# Patient Record
Sex: Female | Born: 1995 | Race: White | Hispanic: No | Marital: Single | Smoking: Never smoker
Health system: Southern US, Community
[De-identification: ages and names within clinical notes are randomized; demographics above are authoritative.]

## PROBLEM LIST (undated history)

## (undated) ENCOUNTER — Inpatient Hospital Stay (HOSPITAL_COMMUNITY): Payer: Self-pay

## (undated) ENCOUNTER — Inpatient Hospital Stay (HOSPITAL_COMMUNITY): Payer: BLUE CROSS/BLUE SHIELD

## (undated) DIAGNOSIS — J45909 Unspecified asthma, uncomplicated: Secondary | ICD-10-CM

## (undated) DIAGNOSIS — R569 Unspecified convulsions: Secondary | ICD-10-CM

## (undated) DIAGNOSIS — E079 Disorder of thyroid, unspecified: Secondary | ICD-10-CM

## (undated) DIAGNOSIS — F419 Anxiety disorder, unspecified: Secondary | ICD-10-CM

## (undated) HISTORY — PX: OTHER SURGICAL HISTORY: SHX169

---

## 2015-08-04 NOTE — L&D Delivery Note (Signed)
Delivery Note At 01:03 AM a viable female, "Carmen Harper", was delivered via Vaginal, Spontaneous Delivery (Presentation:OA restituting to ROA). APGARS: 6, 8; weight 7 lbs 9.7 oz (3450 g).   Placenta status: Spontaneous, intact. Cord: 3 vessels, with the following complications: Loose cord around neck/body, easily reduced. Cord pH: NA. Copious thick meconium after delivery.  Anesthesia: Epidural Episiotomy: NA Lacerations: 2nd degree vaginal Suture Repair: 3-0 chromic CT and SH Est. Blood Loss (mL): 300  Mom to postpartum.  Baby to Couplet care / Skin to Skin. Mom plans to breast and bottle feed. Undecided re: contraception. Unable to swallow pills, therefore will only accept liquid or chewable meds. Tyl, Ibuprofen and Percocet ordered in oral form. Inhalers ordered as needed.    Sherre ScarletKimberly Shaely Gadberry, CNM 04/06/16, 4:25 AM

## 2015-08-09 LAB — OB RESULTS CONSOLE ANTIBODY SCREEN: ANTIBODY SCREEN: NEGATIVE

## 2015-08-09 LAB — OB RESULTS CONSOLE GC/CHLAMYDIA
CHLAMYDIA, DNA PROBE: NEGATIVE
GC PROBE AMP, GENITAL: NEGATIVE

## 2015-08-09 LAB — OB RESULTS CONSOLE RPR: RPR: NONREACTIVE

## 2015-08-09 LAB — OB RESULTS CONSOLE HEPATITIS B SURFACE ANTIGEN: Hepatitis B Surface Ag: NEGATIVE

## 2015-08-09 LAB — OB RESULTS CONSOLE HIV ANTIBODY (ROUTINE TESTING): HIV: NONREACTIVE

## 2015-08-09 LAB — OB RESULTS CONSOLE ABO/RH: RH Type: POSITIVE

## 2015-08-09 LAB — OB RESULTS CONSOLE RUBELLA ANTIBODY, IGM: Rubella: IMMUNE

## 2015-12-16 ENCOUNTER — Emergency Department (HOSPITAL_BASED_OUTPATIENT_CLINIC_OR_DEPARTMENT_OTHER)
Admission: EM | Admit: 2015-12-16 | Discharge: 2015-12-16 | Disposition: A | Discharge status: Home / Self Care | Payer: BLUE CROSS/BLUE SHIELD | Attending: Emergency Medicine | Admitting: Emergency Medicine

## 2015-12-16 ENCOUNTER — Encounter (HOSPITAL_BASED_OUTPATIENT_CLINIC_OR_DEPARTMENT_OTHER): Payer: Self-pay

## 2015-12-16 DIAGNOSIS — O26899 Other specified pregnancy related conditions, unspecified trimester: Secondary | ICD-10-CM

## 2015-12-16 DIAGNOSIS — O26892 Other specified pregnancy related conditions, second trimester: Secondary | ICD-10-CM | POA: Diagnosis present

## 2015-12-16 DIAGNOSIS — R109 Unspecified abdominal pain: Secondary | ICD-10-CM | POA: Diagnosis not present

## 2015-12-16 DIAGNOSIS — O9989 Other specified diseases and conditions complicating pregnancy, childbirth and the puerperium: Secondary | ICD-10-CM

## 2015-12-16 DIAGNOSIS — O99512 Diseases of the respiratory system complicating pregnancy, second trimester: Secondary | ICD-10-CM | POA: Diagnosis not present

## 2015-12-16 DIAGNOSIS — R8271 Bacteriuria: Secondary | ICD-10-CM | POA: Diagnosis not present

## 2015-12-16 DIAGNOSIS — O212 Late vomiting of pregnancy: Secondary | ICD-10-CM | POA: Insufficient documentation

## 2015-12-16 DIAGNOSIS — J45909 Unspecified asthma, uncomplicated: Secondary | ICD-10-CM | POA: Insufficient documentation

## 2015-12-16 DIAGNOSIS — Z3A24 24 weeks gestation of pregnancy: Secondary | ICD-10-CM | POA: Diagnosis not present

## 2015-12-16 DIAGNOSIS — O99891 Other specified diseases and conditions complicating pregnancy: Secondary | ICD-10-CM

## 2015-12-16 HISTORY — DX: Unspecified asthma, uncomplicated: J45.909

## 2015-12-16 LAB — CBC WITH DIFFERENTIAL/PLATELET
Basophils Absolute: 0 10*3/uL (ref 0.0–0.1)
Basophils Relative: 0 %
EOS ABS: 0 10*3/uL (ref 0.0–0.7)
Eosinophils Relative: 0 %
HCT: 31 % — ABNORMAL LOW (ref 36.0–46.0)
Hemoglobin: 10.3 g/dL — ABNORMAL LOW (ref 12.0–15.0)
LYMPHS ABS: 1.9 10*3/uL (ref 0.7–4.0)
Lymphocytes Relative: 21 %
MCH: 30.3 pg (ref 26.0–34.0)
MCHC: 33.2 g/dL (ref 30.0–36.0)
MCV: 91.2 fL (ref 78.0–100.0)
MONO ABS: 0.5 10*3/uL (ref 0.1–1.0)
MONOS PCT: 6 %
NEUTROS PCT: 73 %
Neutro Abs: 6.5 10*3/uL (ref 1.7–7.7)
Platelets: 174 10*3/uL (ref 150–400)
RBC: 3.4 MIL/uL — ABNORMAL LOW (ref 3.87–5.11)
RDW: 14.6 % (ref 11.5–15.5)
WBC: 9.1 10*3/uL (ref 4.0–10.5)

## 2015-12-16 LAB — URINALYSIS, ROUTINE W REFLEX MICROSCOPIC
BILIRUBIN URINE: NEGATIVE
GLUCOSE, UA: NEGATIVE mg/dL
HGB URINE DIPSTICK: NEGATIVE
KETONES UR: NEGATIVE mg/dL
Nitrite: NEGATIVE
PH: 7.5 (ref 5.0–8.0)
PROTEIN: NEGATIVE mg/dL
Specific Gravity, Urine: 1.017 (ref 1.005–1.030)

## 2015-12-16 LAB — COMPREHENSIVE METABOLIC PANEL
ALBUMIN: 2.9 g/dL — AB (ref 3.5–5.0)
ALK PHOS: 43 U/L (ref 38–126)
ALT: 30 U/L (ref 14–54)
ANION GAP: 8 (ref 5–15)
AST: 23 U/L (ref 15–41)
BILIRUBIN TOTAL: 0.5 mg/dL (ref 0.3–1.2)
BUN: 6 mg/dL (ref 6–20)
CALCIUM: 8.3 mg/dL — AB (ref 8.9–10.3)
CO2: 22 mmol/L (ref 22–32)
CREATININE: 0.49 mg/dL (ref 0.44–1.00)
Chloride: 106 mmol/L (ref 101–111)
GFR calc Af Amer: >60 mL/min (ref >60)
GFR calc non Af Amer: >60 mL/min (ref >60)
GLUCOSE: 99 mg/dL (ref 65–99)
Potassium: 3.3 mmol/L — ABNORMAL LOW (ref 3.5–5.1)
SODIUM: 136 mmol/L (ref 135–145)
TOTAL PROTEIN: 6.3 g/dL — AB (ref 6.5–8.1)

## 2015-12-16 LAB — LIPASE, BLOOD: Lipase: 20 U/L (ref 11–51)

## 2015-12-16 LAB — URINE MICROSCOPIC-ADD ON: RBC / HPF: NONE SEEN RBC/hpf (ref 0–5)

## 2015-12-16 MED ORDER — POTASSIUM CHLORIDE 20 MEQ/15ML (10%) PO SOLN
40.0000 meq | Freq: Once | ORAL | Status: AC
  Administered 2015-12-16: 40 meq via ORAL
  Filled 2015-12-16: qty 30

## 2015-12-16 MED ORDER — SODIUM CHLORIDE 0.9 % IV BOLUS (SEPSIS)
1000.0000 mL | Freq: Once | INTRAVENOUS | Status: AC
  Administered 2015-12-16: 1000 mL via INTRAVENOUS

## 2015-12-16 MED ORDER — CEPHALEXIN 250 MG/5ML PO SUSR: 500.0000 mg | Freq: Two times a day (BID) | ORAL | Status: DC

## 2015-12-16 NOTE — ED Notes (Signed)
Family at bedside. 

## 2015-12-16 NOTE — ED Notes (Addendum)
[redacted] weeks pregnant-pt unsure if multiple births-c/o abd pain and pressure-denies vaginal bleeding and d/c-G1-NAD-steady gait

## 2015-12-16 NOTE — ED Notes (Signed)
Pt requesting ultrasound. States she has been told she had 2 yolk sacs at her 6 week ultrasound and also that they heard multiple heart rates at another visit. Reports at 18 week US she was told there was only one baby. Family at bedside reports they have a strong family history of multiple births and wants to be certain. Pt c/o feeling dizzy and pain on sides of abdomen since return from beach yesterday. Denies vaginal bleeding

## 2015-12-16 NOTE — ED Notes (Signed)
[facility] rapid response ob nurse, notified that patients and baby look perfect, no problems with heartrate or toco monitor,

## 2015-12-16 NOTE — ED Notes (Signed)
MD at bedside. 

## 2015-12-16 NOTE — ED Provider Notes (Signed)
CSN: [id number]     Arrival date & time 12/16/15  1807 History  By signing my name below, I, [facility], attest that this documentation has been prepared under the direction and in the presence of Pricilla Loveless, MD. Electronically Signed: Randell Patient, ED Scribe. 12/16/2015. 9:26 PM.   Chief Complaint  Patient presents with  . [redacted] weeks pregnant    The history is provided by the patient. No language interpreter was used.   HPI Comments: Carmen Harper is a 20 y.o. female who presents to the Emergency Department complaining of constant, waxing and waning abdominal pain onset yesterday. Pt states that she was traveling back from the beach when she became nauseous followed by one episode of emesis and then by abdominal pain along her bilateral sides and across her upper abdomen. She reports decreased fetal movement over the past 2 days. Per pt, at her most recent US she was told that there was only one fetus present but notes that at a previous US 18 weeks ago that their were two yolk sacs present indicating twins and heard multiple heartbeats at a different visit. Denies diarrhea, fever, dysuria, vaginal bleeding. G1P1A0  Past Medical History  Diagnosis Date  . Asthma    Past Surgical History  Procedure Laterality Date  . Breast tumor      No family history on file. Social History  Substance Use Topics  . Smoking status: Never Smoker   . Smokeless tobacco: None  . Alcohol Use: No   OB History    Gravida Para Term Preterm AB TAB SAB Ectopic Multiple Living   1              Review of Systems  Constitutional: Negative for fever.  Gastrointestinal: Positive for nausea, vomiting and abdominal pain. Negative for diarrhea.  Genitourinary: Negative for dysuria and vaginal bleeding.  All other systems reviewed and are negative.  Allergies  Sulfa antibiotics  Home Medications   Prior to Admission medications   Not on File   BP 106/66 mmHg  Pulse 77  Temp(Src) 98.6  F (37 C) (Oral)  Resp 20  Ht  (1.702 m)  Wt 189 lb (85.73 kg)  BMI 29.59 kg/m2  SpO2 100% Physical Exam  Constitutional: She is oriented to person, place, and time. She appears well-developed and well-nourished.  HENT:  Head: Normocephalic and atraumatic.  Right Ear: External ear normal.  Left Ear: External ear normal.  Nose: Nose normal.  Eyes: Right eye exhibits no discharge. Left eye exhibits no discharge.  Cardiovascular: Normal rate, regular rhythm and normal heart sounds.   Pulmonary/Chest: Effort normal and breath sounds normal.  Abdominal: Soft. There is tenderness. There is no CVA tenderness.  Mild diffuse tenderness. Soft abdomen. Gravid uterus. No CVA tenderness.  Neurological: She is alert and oriented to person, place, and time.  Skin: Skin is warm and dry.  Nursing note and vitals reviewed.   ED Course  Procedures   DIAGNOSTIC STUDIES: Oxygen Saturation is 100% on RA, normal by my interpretation.    COORDINATION OF CARE: 6:54 PM Will order IV fluids and labs. Discussed treatment plan with pt at bedside and pt agreed to plan.   Labs Review Labs Reviewed  URINALYSIS, ROUTINE W REFLEX MICROSCOPIC (NOT AT [facility]) - Abnormal; Notable for the following:    APPearance CLOUDY (*)    Leukocytes, UA SMALL (*)    All other components within normal limits  URINE MICROSCOPIC-ADD ON - Abnormal; Notable for the following:  Squamous Epithelial / LPF 0-5 (*)    Bacteria, UA MANY (*)    All other components within normal limits  COMPREHENSIVE METABOLIC PANEL - Abnormal; Notable for the following:    Potassium 3.3 (*)    Calcium 8.3 (*)    Total Protein 6.3 (*)    Albumin 2.9 (*)    All other components within normal limits  CBC WITH DIFFERENTIAL/PLATELET - Abnormal; Notable for the following:    RBC 3.40 (*)    Hemoglobin 10.3 (*)    HCT 31.0 (*)    All other components within normal limits  URINE CULTURE  LIPASE, BLOOD    Imaging Review No results  found. I have personally reviewed and evaluated these images and lab results as part of my medical decision-making.   EKG Interpretation None      MDM   Final diagnoses:  Abdominal pain affecting pregnancy  Asymptomatic bacteriuria during pregnancy in second trimester    Patient with vague abdominal tightness and pain since yesterday. One episode of vomiting. Reports decreased fetal movement but otherwise no vaginal bleeding, loss of fluid. Patient appears quite well. Patient symptoms are better after IV fluids. [facility] Fetal heart rate tracing is benign and no contractions. I did do a limited bedside ultrasound and only saw one fetus which I have discussed with patient. She does not have specific urinary symptoms but with complaints of abdominal pain and bacteria with some small leukocytes, will treat as asymptomatic bacteria versus UTI. Sure he has follow-up with her OB in 3 days. Discussed strict return precautions.  I personally performed the services described in this documentation, which was scribed in my presence. The recorded information has been reviewed and is accurate.    Pricilla LovelessScott Saraphina Lauderbaugh, MD 12/17/15 [id number]

## 2015-12-16 NOTE — ED Notes (Signed)
Ob rapid response notified that patient was placed on toco monitor, pt with no complaints currently, family at bedside. [facility] interpretation of monitor

## 2015-12-18 LAB — URINE CULTURE

## 2016-01-08 ENCOUNTER — Other Ambulatory Visit (HOSPITAL_COMMUNITY): Payer: Self-pay | Admitting: Obstetrics and Gynecology

## 2016-01-08 ENCOUNTER — Encounter (HOSPITAL_COMMUNITY): Payer: Self-pay | Admitting: *Deleted

## 2016-01-08 ENCOUNTER — Inpatient Hospital Stay (HOSPITAL_COMMUNITY)
Admission: AD | Admit: 2016-01-08 | Discharge: 2016-01-08 | Disposition: A | Discharge status: Home / Self Care | Payer: BLUE CROSS/BLUE SHIELD | Source: Ambulatory Visit | Attending: Obstetrics and Gynecology | Admitting: Obstetrics and Gynecology

## 2016-01-08 DIAGNOSIS — O368120 Decreased fetal movements, second trimester, not applicable or unspecified: Secondary | ICD-10-CM | POA: Insufficient documentation

## 2016-01-08 DIAGNOSIS — IMO0002 Reserved for concepts with insufficient information to code with codable children: Secondary | ICD-10-CM

## 2016-01-08 DIAGNOSIS — Z3A00 Weeks of gestation of pregnancy not specified: Secondary | ICD-10-CM | POA: Diagnosis not present

## 2016-01-08 NOTE — Discharge Instructions (Signed)
Fetal Movement Counts °Patient Name: __________________________________________________ Patient Due Date: ____________________ °Performing a fetal movement count is highly recommended in high-risk pregnancies, but it is good for every pregnant woman to do. Your health care provider may ask you to start counting fetal movements at 28 weeks of the pregnancy. Fetal movements often increase: °· After eating a full meal. °· After physical activity. °· After eating or drinking something sweet or cold. °· At rest. °Pay attention to when you feel the baby is most active. This will help you notice a pattern of your baby's sleep and wake cycles and what factors contribute to an increase in fetal movement. It is important to perform a fetal movement count at the same time each day when your baby is normally most active.  °HOW TO COUNT FETAL MOVEMENTS °· Find a quiet and comfortable area to sit or lie down on your left side. Lying on your left side provides the best blood and oxygen circulation to your baby. °· Write down the day and time on a sheet of paper or in a journal. °· Start counting kicks, flutters, swishes, rolls, or jabs in a 2-hour period. You should feel at least 10 movements within 2 hours. °· If you do not feel 10 movements in 2 hours, wait 2-3 hours and count again. Look for a change in the pattern or not enough counts in 2 hours. °SEEK MEDICAL CARE IF: °· You feel less than 10 counts in 2 hours, tried twice. °· There is no movement in over an hour. °· The pattern is changing or taking longer each day to reach 10 counts in 2 hours. °· You feel the baby is not moving as he or she usually does. °Date: ____________ Movements: ____________ Start time: ____________ Finish time: ____________  °Date: ____________ Movements: ____________ Start time: ____________ Finish time: ____________ °Date: ____________ Movements: ____________ Start time: ____________ Finish time: ____________ °Date: ____________ Movements:  ____________ Start time: ____________ Finish time: ____________ °Date: ____________ Movements: ____________ Start time: ____________ Finish time: ____________ °Date: ____________ Movements: ____________ Start time: ____________ Finish time: ____________ °Date: ____________ Movements: ____________ Start time: ____________ Finish time: ____________ °Date: ____________ Movements: ____________ Start time: ____________ Finish time: ____________  °Date: ____________ Movements: ____________ Start time: ____________ Finish time: ____________ °Date: ____________ Movements: ____________ Start time: ____________ Finish time: ____________ °Date: ____________ Movements: ____________ Start time: ____________ Finish time: ____________ °Date: ____________ Movements: ____________ Start time: ____________ Finish time: ____________ °Date: ____________ Movements: ____________ Start time: ____________ Finish time: ____________ °Date: ____________ Movements: ____________ Start time: ____________ Finish time: ____________ °Date: ____________ Movements: ____________ Start time: ____________ Finish time: ____________  °Date: ____________ Movements: ____________ Start time: ____________ Finish time: ____________ °Date: ____________ Movements: ____________ Start time: ____________ Finish time: ____________ °Date: ____________ Movements: ____________ Start time: ____________ Finish time: ____________ °Date: ____________ Movements: ____________ Start time: ____________ Finish time: ____________ °Date: ____________ Movements: ____________ Start time: ____________ Finish time: ____________ °Date: ____________ Movements: ____________ Start time: ____________ Finish time: ____________ °Date: ____________ Movements: ____________ Start time: ____________ Finish time: ____________  °Date: ____________ Movements: ____________ Start time: ____________ Finish time: ____________ °Date: ____________ Movements: ____________ Start time: ____________ Finish  time: ____________ °Date: ____________ Movements: ____________ Start time: ____________ Finish time: ____________ °Date: ____________ Movements: ____________ Start time: ____________ Finish time: ____________ °Date: ____________ Movements: ____________ Start time: ____________ Finish time: ____________ °Date: ____________ Movements: ____________ Start time: ____________ Finish time: ____________ °Date: ____________ Movements: ____________ Start time: ____________ Finish time: ____________  °Date: ____________ Movements: ____________ Start time: ____________ Finish   time: ____________ Date: ____________ Movements: ____________ Start time: ____________ Doreatha Martin time: ____________ Date: ____________ Movements: ____________ Start time: ____________ Doreatha Martin time: ____________ Date: ____________ Movements: ____________ Start time: ____________ Doreatha Martin time: ____________ Date: ____________ Movements: ____________ Start time: ____________ Doreatha Martin time: ____________ Date: ____________ Movements: ____________ Start time: ____________ Doreatha Martin time: ____________ Date: ____________ Movements: ____________ Start time: ____________ Doreatha Martin time: ____________  Date: ____________ Movements: ____________ Start time: ____________ Doreatha Martin time: ____________ Date: ____________ Movements: ____________ Start time: ____________ Doreatha Martin time: ____________ Date: ____________ Movements: ____________ Start time: ____________ Doreatha Martin time: ____________ Date: ____________ Movements: ____________ Start time: ____________ Doreatha Martin time: ____________ Date: ____________ Movements: ____________ Start time: ____________ Doreatha Martin time: ____________ Date: ____________ Movements: ____________ Start time: ____________ Doreatha Martin time: ____________ Date: ____________ Movements: ____________ Start time: ____________ Doreatha Martin time: ____________  Date: ____________ Movements: ____________ Start time: ____________ Doreatha Martin time: ____________ Date: ____________  Movements: ____________ Start time: ____________ Doreatha Martin time: ____________ Date: ____________ Movements: ____________ Start time: ____________ Doreatha Martin time: ____________ Date: ____________ Movements: ____________ Start time: ____________ Doreatha Martin time: ____________ Date: ____________ Movements: ____________ Start time: ____________ Doreatha Martin time: ____________ Date: ____________ Movements: ____________ Start time: ____________ Doreatha Martin time: ____________ Date: ____________ Movements: ____________ Start time: ____________ Doreatha Martin time: ____________  Date: ____________ Movements: ____________ Start time: ____________ Doreatha Martin time: ____________ Date: ____________ Movements: ____________ Start time: ____________ Doreatha Martin time: ____________ Date: ____________ Movements: ____________ Start time: ____________ Doreatha Martin time: ____________ Date: ____________ Movements: ____________ Start time: ____________ Doreatha Martin time: ____________ Date: ____________ Movements: ____________ Start time: ____________ Doreatha Martin time: ____________ Date: ____________ Movements: ____________ Start time: ____________ Doreatha Martin time: ____________   This information is not intended to replace advice given to you by your health care provider. Make sure you discuss any questions you have with your health care provider.   Document Released: 08/19/2006 Document Revised: 08/10/2014 Document Reviewed: 05/16/2012 Elsevier Interactive Patient Education 2016 [employer] Round Ligament Pain The round ligament is a cord of muscle and tissue that helps to support the uterus. It can become a source of pain during pregnancy if it becomes stretched or twisted as the baby grows. The pain usually begins in the second trimester of pregnancy, and it can come and go until the baby is delivered. It is not a serious problem, and it does not cause harm to the baby. Round ligament pain is usually a short, sharp, and pinching pain, but it can also be a dull, lingering, and  aching pain. The pain is felt in the lower side of the abdomen or in the groin. It usually starts deep in the groin and moves up to the outside of the hip area. Pain can occur with:  A sudden change in position.  Rolling over in bed.  Coughing or sneezing.  Physical activity. HOME CARE INSTRUCTIONS Watch your condition for any changes. Take these steps to help with your pain:  When the pain starts, relax. Then try:  Sitting down.  Flexing your knees up to your abdomen.  Lying on your side with one pillow under your abdomen and another pillow between your legs.  Sitting in a warm bath for 15-20 minutes or until the pain goes away.  Take over-the-counter and prescription medicines only as told by your health care provider.  Move slowly when you sit and stand.  Avoid long walks if they cause pain.  Stop or lessen your physical activities if they cause pain. SEEK MEDICAL CARE IF:  Your pain does not go away with treatment.  You feel pain in your back that you did not have  before.  Your medicine is not helping. SEEK IMMEDIATE MEDICAL CARE IF:  You develop a fever or chills.  You develop uterine contractions.  You develop vaginal bleeding.  You develop nausea or vomiting.  You develop diarrhea.  You have pain when you urinate.   This information is not intended to replace advice given to you by your health care provider. Make sure you discuss any questions you have with your health care provider.   Document Released: 04/28/2008 Document Revised: 10/12/2011 Document Reviewed: 09/26/2014 Elsevier Interactive Patient Education [employer]

## 2016-01-08 NOTE — MAU Note (Signed)
C/o decreased fetal movement; fetus has some abnormalities (one short femur; offset ears; IUGR) ; breech presentation; pt desires a u/s;

## 2016-01-08 NOTE — MAU Provider Note (Signed)
History   [id number]   Chief Complaint  Patient presents with  . Decreased Fetal Movement    HPI Carmen Harper is a 20 y.o. female  G1P0 here with report of decreased fetal movement since yesterday.  Reports feeling the baby move approximately 3 times in the past 24 hour.  Denies vaginal bleeding or leaking of fluid. Reports lower pelvic pressure & bilateral thigh pain that has been ongoing; no change today. Last seen in office yesterday by Dr. Normand Sloopillard. Per patient, there are some thing wrong with baby ("long head, ears in wrong position, measuring small, small femur"). Has consult with MFM this Friday. Pt & family very concerned & requesting to have consult moved up to today.     No LMP recorded. Patient is pregnant.  OB History  Gravida Para Term Preterm AB SAB TAB Ectopic Multiple Living  1             # Outcome Date GA Lbr Len/2nd Weight Sex Delivery Anes PTL Lv  1 Current               Past Medical History  Diagnosis Date  . Asthma     Family History  Problem Relation Age of Onset  . Alcohol abuse Neg Hx   . Arthritis Neg Hx   . Asthma Neg Hx   . Birth defects Neg Hx   . Cancer Neg Hx   . COPD Neg Hx   . Depression Neg Hx   . Diabetes Neg Hx   . Drug abuse Neg Hx   . Early death Neg Hx   . Hearing loss Neg Hx   . Heart disease Neg Hx   . Hyperlipidemia Neg Hx   . Hypertension Neg Hx   . Kidney disease Neg Hx   . Learning disabilities Neg Hx   . Mental illness Neg Hx   . Mental retardation Neg Hx   . Miscarriages / Stillbirths Neg Hx   . Stroke Neg Hx   . Vision loss Neg Hx   . Varicose Veins Neg Hx     Social History   Social History  . Marital Status: Single    Spouse Name: N/A  . Number of Children: N/A  . Years of Education: N/A   Social History Main Topics  . Smoking status: Never Smoker   . Smokeless tobacco: None  . Alcohol Use: No  . Drug Use: No  . Sexual Activity: Not Asked   Other Topics Concern  . None   Social History Narrative     Allergies  Allergen Reactions  . Sulfa Antibiotics Hives    No current facility-administered medications on file prior to encounter.   Current Outpatient Prescriptions on File Prior to Encounter  Medication Sig Dispense Refill  . cephALEXin (KEFLEX) 250 MG/5ML suspension Take 10 mLs (500 mg total) by mouth 2 (two) times daily. For 7 days (Patient not taking: Reported on 01/08/2016) 140 mL 0     Review of Systems  Constitutional: Negative.   Gastrointestinal:       +pelvic pressure (ongoing issue)  Genitourinary: Negative for dysuria, vaginal bleeding, vaginal discharge and vaginal pain.     Physical Exam   Filed Vitals:   01/08/16 1416  BP: 125/73  Pulse: 71  Resp: 18    Physical Exam  Nursing note and vitals reviewed. Constitutional: She is oriented to person, place, and time. She appears well-developed and well-nourished. No distress.  HENT:  Head: Normocephalic and atraumatic.  Eyes: Conjunctivae are normal. Right eye exhibits no discharge. Left eye exhibits no discharge. No scleral icterus.  Neck: Normal range of motion.  Cardiovascular: Normal rate.   Respiratory: Effort normal. No respiratory distress.  GI: Soft. There is no tenderness.  Neurological: She is alert and oriented to person, place, and time.  Skin: Skin is warm and dry. She is not diaphoretic.  Psychiatric: She has a normal mood and affect. Her behavior is normal. Judgment and thought content normal.   Fetal Tracing:  Baseline: 145 Variability: moderate Accelerations: 15x15 Decelerations: none  Toco: none   MAU Course  Procedures  MDM Reactive tracing S/w Dr. Normand Sloop. Pt ok to discharge home. Pt should return for MFM consult on Friday as scheduled. Reassured pt that fetal tracing was reactive. Pt has perceived some movement since being in MAU.   Assessment and Plan  A: 1. Decreased fetal movement, second trimester, not applicable or unspecified fetus     P: Discharge  home Return to MFM as scheduled Fetal kick form Recommended maternity support belt for pelvic pressure & low back pain   [name], NP 01/08/2016 1:43 PM

## 2016-01-09 ENCOUNTER — Encounter (HOSPITAL_COMMUNITY): Payer: Self-pay

## 2016-01-10 ENCOUNTER — Encounter (HOSPITAL_COMMUNITY): Payer: Self-pay

## 2016-01-10 ENCOUNTER — Ambulatory Visit (HOSPITAL_COMMUNITY)
Admit: 2016-01-10 | Discharge: 2016-01-10 | Disposition: A | Discharge status: Home / Self Care | Payer: BLUE CROSS/BLUE SHIELD | Attending: Obstetrics and Gynecology | Admitting: Obstetrics and Gynecology

## 2016-01-10 ENCOUNTER — Other Ambulatory Visit (HOSPITAL_COMMUNITY): Payer: Self-pay | Admitting: Obstetrics and Gynecology

## 2016-01-10 ENCOUNTER — Ambulatory Visit (HOSPITAL_COMMUNITY): Admit: 2016-01-10 | Payer: BLUE CROSS/BLUE SHIELD

## 2016-01-10 ENCOUNTER — Ambulatory Visit (HOSPITAL_COMMUNITY): Admission: RE | Admit: 2016-01-10 | Payer: Self-pay | Source: Ambulatory Visit

## 2016-01-10 DIAGNOSIS — Z36 Encounter for antenatal screening of mother: Secondary | ICD-10-CM | POA: Diagnosis not present

## 2016-01-10 DIAGNOSIS — Z3A28 28 weeks gestation of pregnancy: Secondary | ICD-10-CM | POA: Diagnosis not present

## 2016-01-10 DIAGNOSIS — Z3A27 27 weeks gestation of pregnancy: Secondary | ICD-10-CM

## 2016-01-10 DIAGNOSIS — IMO0002 Reserved for concepts with insufficient information to code with codable children: Secondary | ICD-10-CM

## 2016-01-10 DIAGNOSIS — Z3689 Encounter for other specified antenatal screening: Secondary | ICD-10-CM

## 2016-01-10 DIAGNOSIS — O26843 Uterine size-date discrepancy, third trimester: Secondary | ICD-10-CM | POA: Diagnosis present

## 2016-02-07 ENCOUNTER — Ambulatory Visit (HOSPITAL_COMMUNITY)
Admission: RE | Admit: 2016-02-07 | Discharge: 2016-02-07 | Disposition: A | Discharge status: Home / Self Care | Payer: Medicaid Other | Source: Ambulatory Visit | Attending: Obstetrics and Gynecology | Admitting: Obstetrics and Gynecology

## 2016-02-07 ENCOUNTER — Encounter (HOSPITAL_COMMUNITY): Payer: Self-pay

## 2016-02-07 DIAGNOSIS — IMO0002 Reserved for concepts with insufficient information to code with codable children: Secondary | ICD-10-CM

## 2016-02-07 DIAGNOSIS — Z3A32 32 weeks gestation of pregnancy: Secondary | ICD-10-CM | POA: Insufficient documentation

## 2016-02-07 DIAGNOSIS — Z36 Encounter for antenatal screening of mother: Secondary | ICD-10-CM | POA: Diagnosis present

## 2016-02-25 ENCOUNTER — Inpatient Hospital Stay (HOSPITAL_COMMUNITY)
Admission: AD | Admit: 2016-02-25 | Discharge: 2016-02-25 | Disposition: A | Discharge status: Home / Self Care | Payer: Medicaid Other | Source: Ambulatory Visit | Attending: Obstetrics and Gynecology | Admitting: Obstetrics and Gynecology

## 2016-02-25 ENCOUNTER — Encounter (HOSPITAL_COMMUNITY): Payer: Self-pay | Admitting: *Deleted

## 2016-02-25 DIAGNOSIS — O99513 Diseases of the respiratory system complicating pregnancy, third trimester: Secondary | ICD-10-CM | POA: Insufficient documentation

## 2016-02-25 DIAGNOSIS — Z3A35 35 weeks gestation of pregnancy: Secondary | ICD-10-CM | POA: Diagnosis not present

## 2016-02-25 DIAGNOSIS — O9989 Other specified diseases and conditions complicating pregnancy, childbirth and the puerperium: Secondary | ICD-10-CM | POA: Diagnosis not present

## 2016-02-25 DIAGNOSIS — R51 Headache: Secondary | ICD-10-CM | POA: Diagnosis not present

## 2016-02-25 DIAGNOSIS — J45909 Unspecified asthma, uncomplicated: Secondary | ICD-10-CM | POA: Insufficient documentation

## 2016-02-25 DIAGNOSIS — R109 Unspecified abdominal pain: Secondary | ICD-10-CM | POA: Diagnosis present

## 2016-02-25 DIAGNOSIS — R519 Headache, unspecified: Secondary | ICD-10-CM

## 2016-02-25 DIAGNOSIS — R42 Dizziness and giddiness: Secondary | ICD-10-CM | POA: Diagnosis not present

## 2016-02-25 DIAGNOSIS — O26893 Other specified pregnancy related conditions, third trimester: Secondary | ICD-10-CM

## 2016-02-25 DIAGNOSIS — O26899 Other specified pregnancy related conditions, unspecified trimester: Secondary | ICD-10-CM

## 2016-02-25 LAB — CBC
HCT: 31.1 % — ABNORMAL LOW (ref 36.0–46.0)
Hemoglobin: 10.3 g/dL — ABNORMAL LOW (ref 12.0–15.0)
MCH: 27.4 pg (ref 26.0–34.0)
MCHC: 33.1 g/dL (ref 30.0–36.0)
MCV: 82.7 fL (ref 78.0–100.0)
Platelets: 190 10*3/uL (ref 150–400)
RBC: 3.76 MIL/uL — ABNORMAL LOW (ref 3.87–5.11)
RDW: 14.5 % (ref 11.5–15.5)
WBC: 8.9 10*3/uL (ref 4.0–10.5)

## 2016-02-25 LAB — URINALYSIS, ROUTINE W REFLEX MICROSCOPIC
Bilirubin Urine: NEGATIVE
GLUCOSE, UA: NEGATIVE mg/dL
HGB URINE DIPSTICK: NEGATIVE
KETONES UR: NEGATIVE mg/dL
Nitrite: NEGATIVE
PH: 6.5 (ref 5.0–8.0)
PROTEIN: NEGATIVE mg/dL
Specific Gravity, Urine: 1.01 (ref 1.005–1.030)

## 2016-02-25 LAB — COMPREHENSIVE METABOLIC PANEL
ALBUMIN: 3 g/dL — AB (ref 3.5–5.0)
ALT: 12 U/L — ABNORMAL LOW (ref 14–54)
ANION GAP: 7 (ref 5–15)
AST: 19 U/L (ref 15–41)
Alkaline Phosphatase: 101 U/L (ref 38–126)
BILIRUBIN TOTAL: 0.5 mg/dL (ref 0.3–1.2)
BUN: 8 mg/dL (ref 6–20)
CALCIUM: 9 mg/dL (ref 8.9–10.3)
CO2: 24 mmol/L (ref 22–32)
Chloride: 105 mmol/L (ref 101–111)
Creatinine, Ser: 0.41 mg/dL — ABNORMAL LOW (ref 0.44–1.00)
GFR calc non Af Amer: >60 mL/min (ref >60)
GLUCOSE: 75 mg/dL (ref 65–99)
POTASSIUM: 3.6 mmol/L (ref 3.5–5.1)
SODIUM: 136 mmol/L (ref 135–145)
TOTAL PROTEIN: 6.3 g/dL — AB (ref 6.5–8.1)

## 2016-02-25 LAB — URINE MICROSCOPIC-ADD ON

## 2016-02-25 MED ORDER — OXYCODONE HCL 5 MG/5ML PO SOLN
10.0000 mg | Freq: Once | ORAL | Status: AC
  Administered 2016-02-25: 10 mg via ORAL
  Filled 2016-02-25: qty 10

## 2016-02-25 MED ORDER — OXYCODONE-ACETAMINOPHEN 5-325 MG/5ML PO SOLN: 5.0000 mL | Freq: Four times a day (QID) | ORAL | 0 refills | Status: DC | PRN

## 2016-02-25 MED ORDER — ONDANSETRON 8 MG PO TBDP
8.0000 mg | ORAL_TABLET | Freq: Once | ORAL | Status: AC
  Administered 2016-02-25: 8 mg via ORAL
  Filled 2016-02-25: qty 1

## 2016-02-25 MED ORDER — ONDANSETRON 8 MG PO TBDP: 8.0000 mg | ORAL_TABLET | Freq: Three times a day (TID) | ORAL | 1 refills | Status: DC | PRN

## 2016-02-25 NOTE — MAU Provider Note (Signed)
Chief Complaint:  Abdominal Pain   First Provider Initiated Contact with Patient 02/25/16 1437     HPI: Carmen Harper is a 20 y.o. G1P0 at [redacted]w[redacted]d who presents to maternity admissions reporting multiple complaints since yesterday: 1. Lightheadedness 2. Sharp, intermittent upper abdominal pain radiating down to low abdomen, improving. 3. Mild chest pain and palpitations. States she has a history of "premature heartbeats" and this feels the same. 4. Shortness of breath 5. Pounding frontal headache radiating down back of neck that she rates 5/10 on pain scale. No improvement with Tylenol- makes her feel sick. 6. Increased nausea and vomiting 7. Decreased appetite. 8. Generally feeling sick  Associated signs and symptoms:  Possible mild, irregular Braxton Hicks. Negative for fever, chills, sick contacts, vaginal bleeding, vaginal discharge, urinary complaints, diarrhea, constipation. Good fetal movement.   States she was seen at [facility] yesterday, but Dr. had to leave emergently so she was unable to discuss these concerns.  Past Medical History: Past Medical History:  Diagnosis Date  . Asthma     Past obstetric history: OB History  Gravida Para Term Preterm AB Living  1         0  SAB TAB Ectopic Multiple Live Births               # Outcome Date GA Lbr Len/2nd Weight Sex Delivery Anes PTL Lv  1 Current               Past Surgical History: Past Surgical History:  Procedure Laterality Date  . breast tumor        Family History: Family History  Problem Relation Age of Onset  . Alcohol abuse Neg Hx   . Arthritis Neg Hx   . Asthma Neg Hx   . Birth defects Neg Hx   . Cancer Neg Hx   . COPD Neg Hx   . Depression Neg Hx   . Diabetes Neg Hx   . Drug abuse Neg Hx   . Early death Neg Hx   . Hearing loss Neg Hx   . Heart disease Neg Hx   . Hyperlipidemia Neg Hx   . Hypertension Neg Hx   . Kidney disease Neg Hx   . Learning disabilities Neg Hx   . Mental illness Neg Hx   .  Mental retardation Neg Hx   . Miscarriages / Stillbirths Neg Hx   . Stroke Neg Hx   . Vision loss Neg Hx   . Varicose Veins Neg Hx     Social History: Social History  Substance Use Topics  . Smoking status: Never Smoker  . Smokeless tobacco: Never Used  . Alcohol use No    Allergies:  Allergies  Allergen Reactions  . Sulfa Antibiotics Hives    Meds:  Prescriptions Prior to Admission  Medication Sig Dispense Refill Last Dose  . beclomethasone (QVAR) 80 MCG/ACT inhaler Inhale 2 puffs into the lungs 2 (two) times daily.    02/25/2016 at Unknown time  . diphenhydrAMINE (BENADRYL) 25 mg capsule Take 25 mg by mouth as needed for allergies.    Past Week at Unknown time  . Prenatal Vit-Fe Fumarate-FA (MULTIVITAMIN-PRENATAL) 27-0.8 MG TABS tablet Take 1 tablet by mouth daily at 12 noon.    02/25/2016 at Unknown time  . albuterol (VENTOLIN HFA) 108 (90 Base) MCG/ACT inhaler Inhale 1-2 puffs into the lungs every 6 (six) hours as needed for wheezing or shortness of breath.    prn    I  have reviewed patient's Past Medical Hx, Surgical Hx, Family Hx, Social Hx, medications and allergies.   ROS:  Review of Systems  Constitutional: Positive for appetite change and fatigue. Negative for chills, diaphoresis and fever.  HENT: Negative for congestion, ear pain, rhinorrhea, sinus pressure and sore throat.   Respiratory: Positive for shortness of breath. Negative for cough, chest tightness and wheezing.   Cardiovascular: Positive for chest pain and palpitations. Negative for leg swelling.  Gastrointestinal: Positive for abdominal pain, nausea and vomiting. Negative for abdominal distention, blood in stool, constipation and diarrhea.  Genitourinary: Negative for dysuria, flank pain, hematuria, pelvic pain, vaginal bleeding and vaginal discharge.  Musculoskeletal: Positive for neck pain. Negative for back pain and gait problem.  Neurological: Positive for dizziness, light-headedness and headaches.  Negative for syncope, facial asymmetry, speech difficulty, weakness and numbness.    Physical Exam  Patient Vitals for the past 24 hrs:  BP Temp Temp src Pulse Resp  02/25/16 1736 133/79 - - 79 18  02/25/16 1604 131/83 - - 79 -  02/25/16 1602 121/66 - - 77 -  02/25/16 1600 118/68 - - 74 -  02/25/16 1557 124/68 - - 68 -  02/25/16 1347 129/79 98 F (36.7 C) Oral 83 18   Constitutional: Well-developed, well-nourished female in no acute distress.  Skin: No pallor or diaphoresis. Cardiovascular: normal rate and rhythm. No murmurs rubs or gallops  Respiratory: normal effort. Clear to auscultation bilaterally.  GI: Abd soft, non-tender, gravid appropriate for gestational age. Pos BS x 4 MS: Extremities nontender, no edema, normal ROM. Negative Homans  Neurologic: Alert and oriented x 4.  Symmetric facial movement. Normal gait and speech. GU: Neg CVAT.  Pelvic:  Declined    FHT:  Baseline  140 , moderate variability, accelerations present, no decelerations Contractions: none    Labs: Results for orders placed or performed during the hospital encounter of 02/25/16 (from the past 24 hour(s))  Urinalysis, Routine w reflex microscopic (not at [facility])     Status: Abnormal   Collection Time: 02/25/16  1:30 PM  Result Value Ref Range   Color, Urine YELLOW YELLOW   APPearance CLOUDY (A) CLEAR   Specific Gravity, Urine 1.010 1.005 - 1.030   pH 6.5 5.0 - 8.0   Glucose, UA NEGATIVE NEGATIVE mg/dL   Hgb urine dipstick NEGATIVE NEGATIVE   Bilirubin Urine NEGATIVE NEGATIVE   Ketones, ur NEGATIVE NEGATIVE mg/dL   Protein, ur NEGATIVE NEGATIVE mg/dL   Nitrite NEGATIVE NEGATIVE   Leukocytes, UA LARGE (A) NEGATIVE  Urine microscopic-add on     Status: Abnormal   Collection Time: 02/25/16  1:30 PM  Result Value Ref Range   Squamous Epithelial / LPF 6-30 (A) NONE SEEN   WBC, UA 6-30 0 - 5 WBC/hpf   RBC / HPF 0-5 0 - 5 RBC/hpf   Bacteria, UA MANY (A) NONE SEEN  CBC     Status: Abnormal    Collection Time: 02/25/16  2:35 PM  Result Value Ref Range   WBC 8.9 4.0 - 10.5 K/uL   RBC 3.76 (L) 3.87 - 5.11 MIL/uL   Hemoglobin 10.3 (L) 12.0 - 15.0 g/dL   HCT 16.1 (L) 09.6 - 04.5 %   MCV 82.7 78.0 - 100.0 fL   MCH 27.4 26.0 - 34.0 pg   MCHC 33.1 30.0 - 36.0 g/dL   RDW 40.9 81.1 - 91.4 %   Platelets 190 150 - 400 K/uL  Comprehensive metabolic panel     Status: Abnormal  Collection Time: 02/25/16  2:35 PM  Result Value Ref Range   Sodium 136 135 - 145 mmol/L   Potassium 3.6 3.5 - 5.1 mmol/L   Chloride 105 101 - 111 mmol/L   CO2 24 22 - 32 mmol/L   Glucose, Bld 75 65 - 99 mg/dL   BUN 8 6 - 20 mg/dL   Creatinine, Ser 8.11 (L) 0.44 - 1.00 mg/dL   Calcium 9.0 8.9 - 91.4 mg/dL   Total Protein 6.3 (L) 6.5 - 8.1 g/dL   Albumin 3.0 (L) 3.5 - 5.0 g/dL   AST 19 15 - 41 U/L   ALT 12 (L) 14 - 54 U/L   Alkaline Phosphatase 101 38 - 126 U/L   Total Bilirubin 0.5 0.3 - 1.2 mg/dL   GFR calc non Af Amer >60 >60 mL/min   GFR calc Af Amer >60 >60 mL/min   Anion gap 7 5 - 15    Imaging:  NA   EKG  Normal sinus rhythm  MAU Course: UA, CBC, CMP, EKG, Zofran ODT, push PO fluids. Offered Fioricet, but patient states she cannot take pills. We'll see how she feels after antiemetic and fluids.  Denies dizziness, abdominal pain, chest pain or shortness of breath still having headache. Discuss history, exam, labs, EKG, inability to take pills with Dr. Normand Sloop. May give Oxycodone liquid.   HA resolved.   MDM: - 20 year old female 35 weeks 2 days gestation with a variety of complaints possibly due to a combination of viral syndrome and normal discomforts of pregnancy. No evidence of emergent condition or pregnancy complication. No vomiting while in maternity admissions. Able to keep down oral fluids.  Assessment: 1. Headache in pregnancy, antepartum, third trimester   2. Dizziness   3. Abdominal pain affecting pregnancy     Plan: Discharge home in stable condition Per consult with Dr.  Normand Sloop. .  Preterm labor precautions and fetal kick counts. Encouraged patient to take antiemetic on schedule and push fluids.  Advance diet as tolerated.  Discussed normal discomforts of third trimester and red flags that would prompt return to maternity admissions or ED.  Follow-up Information    [facility] Follow up on 02/28/2016.   Specialty:  Obstetrics and Gynecology Why:  Or sooner as needed if symptoms worsen Contact information: [address] [place] [address] [phone]       [facility] ADMISS[facility] .   Why:  As needed and obstetric emergencies Contact information: [address] [id number] mc [place] [address] [phone]            Medication List    TAKE these medications   beclomethasone 80 MCG/ACT inhaler Commonly known as:  QVAR Inhale 2 puffs into the lungs 2 (two) times daily.   diphenhydrAMINE 25 mg capsule Commonly known as:  BENADRYL Take 25 mg by mouth as needed for allergies.   multivitamin-prenatal 27-0.8 MG Tabs tablet Take 1 tablet by mouth daily at 12 noon.   ondansetron 8 MG disintegrating tablet Commonly known as:  ZOFRAN ODT Take 1 tablet (8 mg total) by mouth every 8 (eight) hours as needed for nausea or vomiting.   oxyCODONE-acetaminophen 5-325 MG/5ML solution Commonly known as:  ROXICET Take 5-10 mLs by mouth every 6 (six) hours as needed (headache).   VENTOLIN HFA 108 (90 Base) MCG/ACT inhaler Generic drug:  albuterol Inhale 1-2 puffs into the lungs every 6 (six) hours as needed for wheezing or  shortness of breath.       [place], [place] 02/25/2016 5:43 PM

## 2016-02-25 NOTE — MAU Note (Signed)
Urine in lab 

## 2016-02-25 NOTE — Discharge Instructions (Signed)
Tension Headache °A tension headache is a feeling of pain, pressure, or aching that is often felt over the front and sides of the head. The pain can be dull, or it can feel tight (constricting). Tension headaches are not normally associated with nausea or vomiting, and they do not get worse with physical activity. Tension headaches can last from 30 minutes to several days. This is the most common type of headache. °CAUSES °The exact cause of this condition is not known. Tension headaches often begin after stress, anxiety, or depression. Other triggers may include: °· Alcohol. °· Too much caffeine, or caffeine withdrawal. °· Respiratory infections, such as colds, flu, or sinus infections. °· Dental problems or teeth clenching. °· Fatigue. °· Holding your head and neck in the same position for a long period of time, such as while using a computer. °· Smoking. °SYMPTOMS °Symptoms of this condition include: °· A feeling of pressure around the head. °· Dull, aching head pain. °· Pain felt over the front and sides of the head. °· Tenderness in the muscles of the head, neck, and shoulders. °DIAGNOSIS °This condition may be diagnosed based on your symptoms and a physical exam. Tests may be done, such as a CT scan or an MRI of your head. These tests may be done if your symptoms are severe or unusual. °TREATMENT °This condition may be treated with lifestyle changes and medicines to help relieve symptoms. °HOME CARE INSTRUCTIONS °Managing Pain °· Take over-the-counter and prescription medicines only as told by your health care provider. °· Lie down in a dark, quiet room when you have a headache. °· If directed, apply ice to the head and neck area: °¨ Put ice in a plastic bag. °¨ Place a towel between your skin and the bag. °¨ Leave the ice on for 20 minutes, 2-3 times per day. °· Use a heating pad or a hot shower to apply heat to the head and neck area as told by your health care provider. °Eating and Drinking °· Eat meals on  a regular schedule. °· Limit alcohol use. °· Decrease your caffeine intake, or stop using caffeine. °General Instructions °· Keep all follow-up visits as told by your health care provider. This is important. °· Keep a headache journal to help find out what may trigger your headaches. For example, write down: °¨ What you eat and drink. °¨ How much sleep you get. °¨ Any change to your diet or medicines. °· Try massage or other relaxation techniques. °· Limit stress. °· Sit up straight, and avoid tensing your muscles. °· Do not use tobacco products, including cigarettes, chewing tobacco, or e-cigarettes. If you need help quitting, ask your health care provider. °· Exercise regularly as told by your health care provider. °· Get 7-9 hours of sleep, or the amount recommended by your health care provider. °SEEK MEDICAL CARE IF: °· Your symptoms are not helped by medicine. °· You have a headache that is different from what you normally experience. °· You have nausea or you vomit. °· You have a fever. °SEEK IMMEDIATE MEDICAL CARE IF: °· Your headache becomes severe. °· You have repeated vomiting. °· You have a stiff neck. °· You have a loss of vision. °· You have problems with speech. °· You have pain in your eye or ear. °· You have muscular weakness or loss of muscle control. °· You lose your balance or you have trouble walking. °· You feel faint or you pass out. °· You have confusion. °  °  This information is not intended to replace advice given to you by your health care provider. Make sure you discuss any questions you have with your health care provider.   Document Released: 07/20/2005 Document Revised: 04/10/2015 Document Reviewed: 11/12/2014 Elsevier Interactive Patient Education [employer]  Third Trimester of Pregnancy The third trimester is from week 29 through week 42, months 7 through 9. The third trimester is a time when the fetus is growing rapidly. At the end of the ninth month, the fetus is about  20 inches in length and weighs 6-10 pounds.  BODY CHANGES Your body goes through many changes during pregnancy. The changes vary from woman to woman.   Your weight will continue to increase. You can expect to gain 25-35 pounds (11-16 kg) by the end of the pregnancy.  You may begin to get stretch marks on your hips, abdomen, and breasts.  You may urinate more often because the fetus is moving lower into your pelvis and pressing on your bladder.  You may develop or continue to have heartburn as a result of your pregnancy.  You may develop constipation because certain hormones are causing the muscles that push waste through your intestines to slow down.  You may develop hemorrhoids or swollen, bulging veins (varicose veins).  You may have pelvic pain because of the weight gain and pregnancy hormones relaxing your joints between the bones in your pelvis. Backaches may result from overexertion of the muscles supporting your posture.  You may have changes in your hair. These can include thickening of your hair, rapid growth, and changes in texture. Some women also have hair loss during or after pregnancy, or hair that feels dry or thin. Your hair will most likely return to normal after your baby is born.  Your breasts will continue to grow and be tender. A yellow discharge may leak from your breasts called colostrum.  Your belly button may stick out.  You may feel short of breath because of your expanding uterus.  You may notice the fetus "dropping," or moving lower in your abdomen.  You may have a bloody mucus discharge. This usually occurs a few days to a week before labor begins.  Your cervix becomes thin and soft (effaced) near your due date. WHAT TO EXPECT AT YOUR PRENATAL EXAMS  You will have prenatal exams every 2 weeks until week 36. Then, you will have weekly prenatal exams. During a routine prenatal visit:  You will be weighed to make sure you and the fetus are growing  normally.  Your blood pressure is taken.  Your abdomen will be measured to track your baby's growth.  The fetal heartbeat will be listened to.  Any test results from the previous visit will be discussed.  You may have a cervical check near your due date to see if you have effaced. At around 36 weeks, your caregiver will check your cervix. At the same time, your caregiver will also perform a test on the secretions of the vaginal tissue. This test is to determine if a type of bacteria, Group B streptococcus, is present. Your caregiver will explain this further. Your caregiver may ask you:  What your birth plan is.  How you are feeling.  If you are feeling the baby move.  If you have had any abnormal symptoms, such as leaking fluid, bleeding, severe headaches, or abdominal cramping.  If you are using any tobacco products, including cigarettes, chewing tobacco, and electronic cigarettes.  If you have any  questions. Other tests or screenings that may be performed during your third trimester include:  Blood tests that check for low iron levels (anemia).  Fetal testing to check the health, activity level, and growth of the fetus. Testing is done if you have certain medical conditions or if there are problems during the pregnancy.  HIV (human immunodeficiency virus) testing. If you are at high risk, you may be screened for HIV during your third trimester of pregnancy. FALSE LABOR You may feel small, irregular contractions that eventually go away. These are called Braxton Hicks contractions, or false labor. Contractions may last for hours, days, or even weeks before true labor sets in. If contractions come at regular intervals, intensify, or become painful, it is best to be seen by your caregiver.  SIGNS OF LABOR   Menstrual-like cramps.  Contractions that are 5 minutes apart or less.  Contractions that start on the top of the uterus and spread down to the lower abdomen and back.  A  sense of increased pelvic pressure or back pain.  A watery or bloody mucus discharge that comes from the vagina. If you have any of these signs before the 37th week of pregnancy, call your caregiver right away. You need to go to the hospital to get checked immediately. HOME CARE INSTRUCTIONS   Avoid all smoking, herbs, alcohol, and unprescribed drugs. These chemicals affect the formation and growth of the baby.  Do not use any tobacco products, including cigarettes, chewing tobacco, and electronic cigarettes. If you need help quitting, ask your health care provider. You may receive counseling support and other resources to help you quit.  Follow your caregiver's instructions regarding medicine use. There are medicines that are either safe or unsafe to take during pregnancy.  Exercise only as directed by your caregiver. Experiencing uterine cramps is a good sign to stop exercising.  Continue to eat regular, healthy meals.  Wear a good support bra for breast tenderness.  Do not use hot tubs, steam rooms, or saunas.  Wear your seat belt at all times when driving.  Avoid raw meat, uncooked cheese, cat litter boxes, and soil used by cats. These carry germs that can cause birth defects in the baby.  Take your prenatal vitamins.  Take 1500-2000 mg of calcium daily starting at the 20th week of pregnancy until you deliver your baby.  Try taking a stool softener (if your caregiver approves) if you develop constipation. Eat more high-fiber foods, such as fresh vegetables or fruit and whole grains. Drink plenty of fluids to keep your urine clear or pale yellow.  Take warm sitz baths to soothe any pain or discomfort caused by hemorrhoids. Use hemorrhoid cream if your caregiver approves.  If you develop varicose veins, wear support hose. Elevate your feet for 15 minutes, 3-4 times a day. Limit salt in your diet.  Avoid heavy lifting, wear low heal shoes, and practice good posture.  Rest a lot  with your legs elevated if you have leg cramps or low back pain.  Visit your dentist if you have not gone during your pregnancy. Use a soft toothbrush to brush your teeth and be gentle when you floss.  A sexual relationship may be continued unless your caregiver directs you otherwise.  Do not travel far distances unless it is absolutely necessary and only with the approval of your caregiver.  Take prenatal classes to understand, practice, and ask questions about the labor and delivery.  Make a trial run to the hospital.  Pack your hospital bag.  Prepare the baby's nursery.  Continue to go to all your prenatal visits as directed by your caregiver. SEEK MEDICAL CARE IF:  You are unsure if you are in labor or if your water has broken.  You have dizziness.  You have mild pelvic cramps, pelvic pressure, or nagging pain in your abdominal area.  You have persistent nausea, vomiting, or diarrhea.  You have a bad smelling vaginal discharge.  You have pain with urination. SEEK IMMEDIATE MEDICAL CARE IF:   You have a fever.  You are leaking fluid from your vagina.  You have spotting or bleeding from your vagina.  You have severe abdominal cramping or pain.  You have rapid weight loss or gain.  You have shortness of breath with chest pain.  You notice sudden or extreme swelling of your face, hands, ankles, feet, or legs.  You have not felt your baby move in over an hour.  You have severe headaches that do not go away with medicine.  You have vision changes.   This information is not intended to replace advice given to you by your health care provider. Make sure you discuss any questions you have with your health care provider.   Document Released: 07/14/2001 Document Revised: 08/10/2014 Document Reviewed: 09/20/2012 Elsevier Interactive Patient Education [employer]

## 2016-02-25 NOTE — MAU Note (Signed)
States yesterday and today, she has not felt well. C/O pain in lower and upper abdomen that is intermittent and sharp. States she has had a HA since yesterday, down into her neck. Tried Tylenol and it just makes her sick. State her vision is "off." States she feels real lightheaded "7 or 8 times" like she is going to pass out, then her vision gets blurred. States she has no appetite, just feels sick. States she had an appointment yesterday at [facility], but the doctor was called away for an emergency and they could not reschedule her until Friday.

## 2016-02-28 LAB — OB RESULTS CONSOLE GBS: STREP GROUP B AG: POSITIVE

## 2016-03-18 ENCOUNTER — Ambulatory Visit (INDEPENDENT_AMBULATORY_CARE_PROVIDER_SITE_OTHER): Payer: Self-pay | Admitting: Pediatrics

## 2016-03-18 DIAGNOSIS — Z7681 Expectant parent(s) prebirth pediatrician visit: Secondary | ICD-10-CM

## 2016-03-18 DIAGNOSIS — Z3490 Encounter for supervision of normal pregnancy, unspecified, unspecified trimester: Secondary | ICD-10-CM

## 2016-03-19 NOTE — Progress Notes (Signed)
Prenatal counseling for impending newborn done-- Z76.81  

## 2016-03-31 ENCOUNTER — Telehealth (HOSPITAL_COMMUNITY): Payer: Self-pay | Admitting: *Deleted

## 2016-03-31 NOTE — Telephone Encounter (Signed)
Preadmission screen  

## 2016-04-03 ENCOUNTER — Other Ambulatory Visit: Payer: Self-pay | Admitting: Obstetrics and Gynecology

## 2016-04-04 DIAGNOSIS — B951 Streptococcus, group B, as the cause of diseases classified elsewhere: Secondary | ICD-10-CM | POA: Insufficient documentation

## 2016-04-04 DIAGNOSIS — J45909 Unspecified asthma, uncomplicated: Secondary | ICD-10-CM | POA: Diagnosis present

## 2016-04-04 DIAGNOSIS — O480 Post-term pregnancy: Secondary | ICD-10-CM | POA: Diagnosis present

## 2016-04-04 NOTE — H&P (Signed)
Carmen Harper is a 20 y.o. female, G1P0 at 2041 weeks, presenting for induction due to postdates.  Denies leaking or bleeding, reports +FM, denies significant HA/visual sx/epigastric pain. Patient is very anxious about induction process.  Patient also reports hx of "premature heartbeat", present before pregnancy.  Had cardiology w/u with Holter monitor, no meds or other treatment needed.  She reports it is more common in times of stress.  Last episode was approx 1 month ago.  Denies any SOB or chest pain with event.  She also reports inability to swallow pills.  Admission was delayed due to unit census.  Patient Active Problem List   Diagnosis Date Noted  . Premature beats 04/05/2016  . Post-dates pregnancy 04/04/2016  . Positive GBS test 04/04/2016  . Asthma 04/04/2016    History of present pregnancy: Patient entered care at 24 2/7 weeks [facility]  .   EDC of 03/29/16 was established by LMP   Anatomy scan:  18 3/7 weeks, with normal findings and posterior placenta at previous office  Additional US evaluations:   25 4/7 weeks at [facility]--EFW 1+8, 5.8%ile, ? Head and femur growth lab, with dolichocephalic head shape, normal fluid. 32 5/7 weeks, at [facility] for f/u:  EFW 4+3, 44%ile, normal growth and anatomy. 36 2/7 weeks for presentation:  Vtx, AFI 11.20, 30%ile. Significant prenatal events:  Transferred in at 25 weeks due to dissatisfaction with previous office. General discomforts during pregnancy.  TDAP given 03/13/16.  GBS positive.  F/u US at [facility] due to ? Growth lag and dolichocephalic face shape, all WNL. Last evaluation:  03/30/16--cervix 1 cm, 50%, vtx, -2, BP 108/68, weight 223.  OB History    Gravida Para Term Preterm AB Living   1         0   SAB TAB Ectopic Multiple Live Births                 Past Medical History:  Diagnosis Date  . Asthma    Past Surgical History:  Procedure Laterality Date  . breast tumor      Family History: HTN mother, MGM;  Cancer mother (colon), MGM, MA (brain); Heart dx mother, MGF, MU.  Social History:  reports that she has never smoked. She has never used smokeless tobacco. She reports that she does not drink alcohol or use drugs.  She is Caucasian, has some college, has worked as a LawyerCNA, single, FOB Quarry manager([employer]) is involved and supportive.   Prenatal Transfer Tool  Maternal Diabetes: No Genetic Screening: Normal 1st trimester screen Maternal Ultrasounds/Referrals: Normal--Dolichocephalic face shape, but WNL per [facility] consult. Fetal Ultrasounds or other Referrals:  Referred to Materal Fetal Medicine for above, WNL Maternal Substance Abuse:  No Significant Maternal Medications:  None Significant Maternal Lab Results: Lab values include: Group B Strep positive  TDAP 03/13/16 Flu 04/2015  ROS:  +FM, occasional UCs.  Allergies  Allergen Reactions  . Sulfa Antibiotics Hives   Initial BP 153/100, repeat 136/82.   Last menstrual period 06/23/2015.  Appears anxious Chest clear Heart RRR without murmur or extra beats Abd gravid, NT, FH  Pelvic: Cervix posterior, FT, 50%, vtx, -2, soft Ext: DTR 1+, no clonus, 1+ edema  FHR: Category 1 UCs:  Irregular, mild, patient unaware.  Prenatal labs: ABO, Rh: A/Positive/-- (01/06 0000) Antibody: Negative (01/06 0000) Rubella:  Immune RPR: Nonreactive (01/06 0000)  HBsAg: Negative (01/06 0000)  HIV: Non-reactive (01/06 0000)  GBS: Positive (07/28 0000) Sickle cell/Hgb electrophoresis:  NA Pap:  NA due to age GC: Negative 08/20/15 and 01/13/16 Chlamydia:  Negative 08/20/15 and 01/13/16 Genetic screenings:   Glucola:  WNL Other:   Hgb 12.6 at NOB, 10.7 at 28 weeks FFN negative 01/28/16   Assessment/Plan: IUP at 41 weeks GBS positive Asthma BMI 35   Plan: Admit to [facility] per consult with Dr. Charlotta Newton Routine [facility] orders Risks and benefits of induction were reviewed, including failure of method, prolonged labor, need for further intervention, risk of  cesarean.  Patient and family seem to understand these risks and wish to proceed. Options of cytotech, foley bulb, AROM, and pitocin reviewed, with use of each discussed. Will plan cytotech as initial method. Pain med/epidural prn--patient does plan epidural. PCN G for GBS prophylaxis with onset of labor or ROM/pitocin initiation.  Yashira Offenberger CNM, [place] 04/05/2016, 5:08 AM   Addendum: Cytotech #1 25 mcg placed on posterior fornix. Tylenol 650 mg po solution q 6 hours prn ordered for mild HA.  PIH labs and PCR added to admit labs.  Dr. Richardson Dopp will assume care at 7am.  Nigel Bridgeman, CNM 04/05/16 5:10am

## 2016-04-04 NOTE — H&P (Deleted)
  The note originally documented on this encounter has been moved the the encounter in which it belongs.  

## 2016-04-05 ENCOUNTER — Inpatient Hospital Stay (HOSPITAL_COMMUNITY): Payer: Medicaid Other | Admitting: Anesthesiology

## 2016-04-05 ENCOUNTER — Inpatient Hospital Stay (HOSPITAL_COMMUNITY)
Admit: 2016-04-05 | Discharge: 2016-04-08 | DRG: 775 | Disposition: A | Discharge status: Home / Self Care | Payer: Medicaid Other | Source: Ambulatory Visit | Attending: Obstetrics and Gynecology | Admitting: Obstetrics and Gynecology

## 2016-04-05 ENCOUNTER — Encounter (HOSPITAL_COMMUNITY): Payer: Self-pay | Admitting: *Deleted

## 2016-04-05 ENCOUNTER — Inpatient Hospital Stay (HOSPITAL_COMMUNITY)
Admission: RE | Admit: 2016-04-05 | Discharge: 2016-04-05 | Disposition: A | Discharge status: Home / Self Care | Payer: Medicaid Other | Source: Ambulatory Visit | Attending: Obstetrics and Gynecology | Admitting: Obstetrics and Gynecology

## 2016-04-05 DIAGNOSIS — F419 Anxiety disorder, unspecified: Secondary | ICD-10-CM | POA: Diagnosis present

## 2016-04-05 DIAGNOSIS — O480 Post-term pregnancy: Secondary | ICD-10-CM | POA: Diagnosis present

## 2016-04-05 DIAGNOSIS — J45909 Unspecified asthma, uncomplicated: Secondary | ICD-10-CM | POA: Diagnosis present

## 2016-04-05 DIAGNOSIS — O99344 Other mental disorders complicating childbirth: Secondary | ICD-10-CM | POA: Diagnosis present

## 2016-04-05 DIAGNOSIS — Z6834 Body mass index (BMI) 34.0-34.9, adult: Secondary | ICD-10-CM | POA: Diagnosis not present

## 2016-04-05 DIAGNOSIS — O99824 Streptococcus B carrier state complicating childbirth: Secondary | ICD-10-CM | POA: Diagnosis present

## 2016-04-05 DIAGNOSIS — Z8249 Family history of ischemic heart disease and other diseases of the circulatory system: Secondary | ICD-10-CM | POA: Diagnosis not present

## 2016-04-05 DIAGNOSIS — O99214 Obesity complicating childbirth: Secondary | ICD-10-CM | POA: Diagnosis present

## 2016-04-05 DIAGNOSIS — B951 Streptococcus, group B, as the cause of diseases classified elsewhere: Secondary | ICD-10-CM

## 2016-04-05 DIAGNOSIS — I4949 Other premature depolarization: Secondary | ICD-10-CM

## 2016-04-05 DIAGNOSIS — E669 Obesity, unspecified: Secondary | ICD-10-CM | POA: Diagnosis present

## 2016-04-05 DIAGNOSIS — Z3A41 41 weeks gestation of pregnancy: Secondary | ICD-10-CM | POA: Diagnosis not present

## 2016-04-05 DIAGNOSIS — O9952 Diseases of the respiratory system complicating childbirth: Secondary | ICD-10-CM | POA: Diagnosis present

## 2016-04-05 LAB — CBC
HCT: 29.9 % — ABNORMAL LOW (ref 36.0–46.0)
HEMATOCRIT: 32.4 % — AB (ref 36.0–46.0)
HEMOGLOBIN: 10.6 g/dL — AB (ref 12.0–15.0)
HEMOGLOBIN: 9.6 g/dL — AB (ref 12.0–15.0)
MCH: 25.7 pg — ABNORMAL LOW (ref 26.0–34.0)
MCH: 25.4 pg — AB (ref 26.0–34.0)
MCHC: 32.7 g/dL (ref 30.0–36.0)
MCHC: 32.1 g/dL (ref 30.0–36.0)
MCV: 78.6 fL (ref 78.0–100.0)
MCV: 79.1 fL (ref 78.0–100.0)
PLATELETS: 189 10*3/uL (ref 150–400)
Platelets: 204 10*3/uL (ref 150–400)
RBC: 4.12 MIL/uL (ref 3.87–5.11)
RBC: 3.78 MIL/uL — AB (ref 3.87–5.11)
RDW: 15.7 % — ABNORMAL HIGH (ref 11.5–15.5)
RDW: 15.6 % — ABNORMAL HIGH (ref 11.5–15.5)
WBC: 8 10*3/uL (ref 4.0–10.5)
WBC: 13.3 10*3/uL — ABNORMAL HIGH (ref 4.0–10.5)

## 2016-04-05 LAB — COMPREHENSIVE METABOLIC PANEL
ALK PHOS: 154 U/L — AB (ref 38–126)
ALT: 19 U/L (ref 14–54)
ANION GAP: 8 (ref 5–15)
AST: 28 U/L (ref 15–41)
Albumin: 3.1 g/dL — ABNORMAL LOW (ref 3.5–5.0)
BILIRUBIN TOTAL: 0.1 mg/dL — AB (ref 0.3–1.2)
BUN: 12 mg/dL (ref 6–20)
CALCIUM: 9.2 mg/dL (ref 8.9–10.3)
CO2: 20 mmol/L — ABNORMAL LOW (ref 22–32)
Chloride: 109 mmol/L (ref 101–111)
Creatinine, Ser: 0.5 mg/dL (ref 0.44–1.00)
Glucose, Bld: 91 mg/dL (ref 65–99)
Potassium: 3.7 mmol/L (ref 3.5–5.1)
Sodium: 137 mmol/L (ref 135–145)
TOTAL PROTEIN: 6.5 g/dL (ref 6.5–8.1)

## 2016-04-05 LAB — TYPE AND SCREEN
ABO/RH(D): A POS
Antibody Screen: NEGATIVE

## 2016-04-05 LAB — ABO/RH: ABO/RH(D): A POS

## 2016-04-05 LAB — URIC ACID: URIC ACID, SERUM: 4.5 mg/dL (ref 2.3–6.6)

## 2016-04-05 LAB — PROTEIN / CREATININE RATIO, URINE
CREATININE, URINE: 62 mg/dL
Protein Creatinine Ratio: 0.15 mg/mg{Cre} (ref 0.00–0.15)
Total Protein, Urine: 9 mg/dL

## 2016-04-05 LAB — RPR: RPR: NONREACTIVE

## 2016-04-05 LAB — LACTATE DEHYDROGENASE: LDH: 207 U/L — AB (ref 98–192)

## 2016-04-05 MED ORDER — LACTATED RINGERS IV SOLN
500.0000 mL | Freq: Once | INTRAVENOUS | Status: AC
  Administered 2016-04-05: 500 mL via INTRAVENOUS

## 2016-04-05 MED ORDER — OXYTOCIN 40 UNITS IN LACTATED RINGERS INFUSION - SIMPLE MED: 2.5000 [IU]/h | INTRAVENOUS | Status: DC

## 2016-04-05 MED ORDER — OXYTOCIN 40 UNITS IN LACTATED RINGERS INFUSION - SIMPLE MED
1.0000 m[IU]/min | INTRAVENOUS | Status: DC
  Filled 2016-04-05: qty 1000

## 2016-04-05 MED ORDER — DIAZEPAM 5 MG/ML IJ SOLN
2.0000 mg | Freq: Once | INTRAMUSCULAR | Status: DC
  Filled 2016-04-05: qty 2

## 2016-04-05 MED ORDER — LIDOCAINE HCL (PF) 1 % IJ SOLN
30.0000 mL | INTRAMUSCULAR | Status: DC | PRN
  Filled 2016-04-05: qty 30

## 2016-04-05 MED ORDER — EPHEDRINE 5 MG/ML INJ
10.0000 mg | INTRAVENOUS | Status: DC | PRN
Start: 2016-04-05 — End: 2016-04-06
  Filled 2016-04-05: qty 4

## 2016-04-05 MED ORDER — PENICILLIN G POTASSIUM 5000000 UNITS IJ SOLR
5.0000 10*6.[IU] | Freq: Once | INTRAVENOUS | Status: AC
  Administered 2016-04-05: 5 10*6.[IU] via INTRAVENOUS
  Filled 2016-04-05: qty 5

## 2016-04-05 MED ORDER — FLEET ENEMA 7-19 GM/118ML RE ENEM: 1.0000 | ENEMA | RECTAL | Status: DC | PRN

## 2016-04-05 MED ORDER — LABETALOL HCL 5 MG/ML IV SOLN: 20.0000 mg | INTRAVENOUS | Status: DC | PRN

## 2016-04-05 MED ORDER — OXYTOCIN 40 UNITS IN LACTATED RINGERS INFUSION - SIMPLE MED
1.0000 m[IU]/min | INTRAVENOUS | Status: DC
  Administered 2016-04-05: 2 m[IU]/min via INTRAVENOUS

## 2016-04-05 MED ORDER — PHENYLEPHRINE 40 MCG/ML (10ML) SYRINGE FOR IV PUSH (FOR BLOOD PRESSURE SUPPORT)
80.0000 ug | PREFILLED_SYRINGE | INTRAVENOUS | Status: DC | PRN
Start: 2016-04-05 — End: 2016-04-06
  Filled 2016-04-05: qty 5

## 2016-04-05 MED ORDER — DIPHENHYDRAMINE HCL 50 MG/ML IJ SOLN: 12.5000 mg | INTRAMUSCULAR | Status: DC | PRN

## 2016-04-05 MED ORDER — ACETAMINOPHEN 160 MG/5ML PO SOLN
650.0000 mg | Freq: Four times a day (QID) | ORAL | Status: DC | PRN
  Administered 2016-04-05 – 2016-04-06 (×2): 650 mg via ORAL
  Filled 2016-04-05 (×3): qty 20.3

## 2016-04-05 MED ORDER — TERBUTALINE SULFATE 1 MG/ML IJ SOLN
0.2500 mg | Freq: Once | INTRAMUSCULAR | Status: DC | PRN
  Filled 2016-04-05: qty 1

## 2016-04-05 MED ORDER — PHENYLEPHRINE 40 MCG/ML (10ML) SYRINGE FOR IV PUSH (FOR BLOOD PRESSURE SUPPORT)
80.0000 ug | PREFILLED_SYRINGE | INTRAVENOUS | Status: DC | PRN
  Filled 2016-04-05: qty 5

## 2016-04-05 MED ORDER — FENTANYL 2.5 MCG/ML BUPIVACAINE 1/10 % EPIDURAL INFUSION (WH - ANES)
14.0000 mL/h | INTRAMUSCULAR | Status: DC | PRN
  Administered 2016-04-05: 14 mL/h via EPIDURAL

## 2016-04-05 MED ORDER — OXYTOCIN BOLUS FROM INFUSION
500.0000 mL | Freq: Once | INTRAVENOUS | Status: AC
  Administered 2016-04-06: 500 mL via INTRAVENOUS

## 2016-04-05 MED ORDER — ACETAMINOPHEN 325 MG PO TABS: 650.0000 mg | ORAL_TABLET | ORAL | Status: DC | PRN

## 2016-04-05 MED ORDER — EPHEDRINE 5 MG/ML INJ
10.0000 mg | INTRAVENOUS | Status: DC | PRN
  Filled 2016-04-05: qty 4

## 2016-04-05 MED ORDER — LACTATED RINGERS IV SOLN: 500.0000 mL | INTRAVENOUS | Status: DC | PRN

## 2016-04-05 MED ORDER — SOD CITRATE-CITRIC ACID 500-334 MG/5ML PO SOLN: 30.0000 mL | ORAL | Status: DC | PRN

## 2016-04-05 MED ORDER — ONDANSETRON HCL 4 MG/2ML IJ SOLN
4.0000 mg | Freq: Four times a day (QID) | INTRAMUSCULAR | Status: DC | PRN
  Administered 2016-04-05: 4 mg via INTRAVENOUS
  Filled 2016-04-05: qty 2

## 2016-04-05 MED ORDER — FENTANYL 2.5 MCG/ML BUPIVACAINE 1/10 % EPIDURAL INFUSION (WH - ANES)
INTRAMUSCULAR | Status: AC
  Filled 2016-04-05: qty 125

## 2016-04-05 MED ORDER — MISOPROSTOL 25 MCG QUARTER TABLET
25.0000 ug | ORAL_TABLET | ORAL | Status: DC | PRN
  Administered 2016-04-05 (×3): 25 ug via VAGINAL
  Filled 2016-04-05 (×3): qty 0.25
  Filled 2016-04-05: qty 1

## 2016-04-05 MED ORDER — FENTANYL CITRATE (PF) 100 MCG/2ML IJ SOLN
50.0000 ug | INTRAMUSCULAR | Status: DC | PRN
  Administered 2016-04-05 (×3): 100 ug via INTRAVENOUS
  Filled 2016-04-05 (×3): qty 2

## 2016-04-05 MED ORDER — LACTATED RINGERS IV SOLN
INTRAVENOUS | Status: DC
  Administered 2016-04-05 (×3): via INTRAVENOUS

## 2016-04-05 MED ORDER — PENICILLIN G POTASSIUM 5000000 UNITS IJ SOLR
2.5000 10*6.[IU] | INTRAVENOUS | Status: DC
  Administered 2016-04-05: 2.5 10*6.[IU] via INTRAVENOUS
  Filled 2016-04-05 (×7): qty 2.5

## 2016-04-05 MED ORDER — PHENYLEPHRINE 40 MCG/ML (10ML) SYRINGE FOR IV PUSH (FOR BLOOD PRESSURE SUPPORT)
PREFILLED_SYRINGE | INTRAVENOUS | Status: AC
  Filled 2016-04-05: qty 10

## 2016-04-05 MED ORDER — LIDOCAINE HCL (PF) 1 % IJ SOLN
INTRAMUSCULAR | Status: DC | PRN
  Administered 2016-04-05 (×2): 4 mL

## 2016-04-05 MED ORDER — HYDRALAZINE HCL 20 MG/ML IJ SOLN: 10.0000 mg | Freq: Once | INTRAMUSCULAR | Status: DC | PRN

## 2016-04-05 NOTE — Progress Notes (Signed)
Carmen Harper is a 20 y.o. G1P0 at 2841w0d  admitted for induction of labor due to Post dates. .  Subjective: Starting to feel increased pain and discomfort.  Objective: BP (!) 146/80   Pulse 73   Temp 97.4 F (36.3 C) (Oral)   Resp 18   Ht 5\' 7"  (1.702 m)   Wt 100.7 kg (222 lb)   LMP 06/23/2015   BMI 34.77 kg/m  No intake/output data recorded. No intake/output data recorded.  FHT:  Category 1 UC:   Irregular  SVE:  2/50/-3 Labs: Lab Results  Component Value Date   WBC 8.0 04/05/2016   HGB 10.6 (L) 04/05/2016   HCT 32.4 (L) 04/05/2016   MCV 78.6 04/05/2016   PLT 204 04/05/2016    Assessment / Plan: 20yo G1P0@ 41 wks and 0 days for induction -FWB: Cat. I -Labor: s/p cytotec, plan for Pit per protocol -Pain management: IV pain medicine upon request -GBS positive, will start PCN when transitioning to active labor or SROM -Gestational HTN: labs negative for preeclampsia, asymptomatic  Myna HidalgoJennifer Wyatte Dames, DO [phone] (pager) [phone] (office)

## 2016-04-05 NOTE — Anesthesia Procedure Notes (Signed)
Epidural Patient location during procedure: OB  Staffing Anesthesiologist: Rishit Burkhalter Performed: anesthesiologist   Preanesthetic Checklist Completed: patient identified, site marked, surgical consent, pre-op evaluation, timeout performed, IV checked, risks and benefits discussed and monitors and equipment checked  Epidural Patient position: sitting Prep: site prepped and draped and DuraPrep Patient monitoring: continuous pulse ox and blood pressure Approach: midline Location: L3-L4 Injection technique: LOR saline  Needle:  Needle type: Tuohy  Needle gauge: 17 G Needle length: 9 cm and 9 Needle insertion depth: 5 cm cm Catheter type: closed end flexible Catheter size: 19 Gauge Catheter at skin depth: 10 cm Test dose: negative  Assessment Events: blood not aspirated, injection not painful, no injection resistance, negative IV test and no paresthesia  Additional Notes Patient identified. Risks/Benefits/Options discussed with patient including but not limited to bleeding, infection, nerve damage, paralysis, failed block, incomplete pain control, headache, blood pressure changes, nausea, vomiting, reactions to medication both or allergic, itching and postpartum back pain. Confirmed with bedside nurse the patient's most recent platelet count. Confirmed with patient that they are not currently taking any anticoagulation, have any bleeding history or any family history of bleeding disorders. Patient expressed understanding and wished to proceed. All questions were answered. Sterile technique was used throughout the entire procedure. Please see nursing notes for vital signs. Test dose was given through epidural catheter and negative prior to continuing to dose epidural or start infusion. Warning signs of high block given to the patient including shortness of breath, tingling/numbness in hands, complete motor block, or any concerning symptoms with instructions to call for help. Patient was  given instructions on fall risk and not to get out of bed. All questions and concerns addressed with instructions to call with any issues or inadequate analgesia.        

## 2016-04-05 NOTE — Anesthesia Pain Management Evaluation Note (Signed)
  CRNA Pain Management Visit Note  Patient: Carmen RilyGrace Harper, 20 y.o., female  "Hello I am a member of the anesthesia team at [facility]. We have an anesthesia team available at all times to provide care throughout the hospital, including epidural management and anesthesia for C-section. I don't know your plan for the delivery whether it a natural birth, water birth, IV sedation, nitrous supplementation, doula or epidural, but we want to meet your pain goals."   1.Was your pain managed to your expectations on prior hospitalizations?   No prior hospitalizations  2.What is your expectation for pain management during this hospitalization?     Epidural  3.How can we help you reach that goal? Patient would like epidural when pain gets worse  Record the patient's initial score and the patient's pain goal.   Pain: 4  Pain Goal: 10 [facility] wants you to be able to say your pain was always managed very well.  Rica RecordsICKELTON,Marcell Pfeifer 04/05/2016

## 2016-04-05 NOTE — Anesthesia Preprocedure Evaluation (Signed)
Anesthesia Evaluation  Patient identified by MRN, date of birth, ID band Patient awake    Reviewed: Allergy & Precautions, NPO status , Patient's Chart, lab work & pertinent test results  Airway Mallampati: II  TM Distance: >3 FB Neck ROM: Full    Dental no notable dental hx. (+) Dental Advisory Given   Pulmonary asthma ,    Pulmonary exam normal breath sounds clear to auscultation       Cardiovascular negative cardio ROS Normal cardiovascular exam Rhythm:Regular Rate:Normal     Neuro/Psych negative neurological ROS  negative psych ROS   GI/Hepatic negative GI ROS, Neg liver ROS,   Endo/Other  obesity  Renal/GU negative Renal ROS  negative genitourinary   Musculoskeletal negative musculoskeletal ROS (+)   Abdominal   Peds negative pediatric ROS (+)  Hematology negative hematology ROS (+)   Anesthesia Other Findings   Reproductive/Obstetrics (+) Pregnancy                             Anesthesia Physical Anesthesia Plan  ASA: II  Anesthesia Plan: Epidural   Post-op Pain Management:    Induction:   Airway Management Planned:   Additional Equipment:   Intra-op Plan:   Post-operative Plan:   Informed Consent: I have reviewed the patients History and Physical, chart, labs and discussed the procedure including the risks, benefits and alternatives for the proposed anesthesia with the patient or authorized representative who has indicated his/her understanding and acceptance.   Dental advisory given  Plan Discussed with: CRNA  Anesthesia Plan Comments:         Anesthesia Quick Evaluation

## 2016-04-05 NOTE — Progress Notes (Signed)
Sitting up for epidural. Moderate mec now - witnessed by RN. Will place IUPC once comfortable w/ epidural.   Sherre ScarletKimberly Eliyana Pagliaro, CNM 04/05/16, 10:44 PM

## 2016-04-05 NOTE — Progress Notes (Signed)
  Subjective: Very comfortable w/ epidural; yet still feeling pressure. Did not require IV Valium dose prior to epidural placement (d/c'd). Reports mild headache and intermittent spots before eyes. Denies epigastric pain, CP or SOB. Some nausea.   Objective: BP 140/78   Pulse 71   Temp 98.4 F (36.9 C) (Axillary)   Resp 18   Ht 5\' 7"  (1.702 m)   Wt 100.7 kg (222 lb)   LMP 06/23/2015   SpO2 100%   BMI 34.77 kg/m  No intake/output data recorded. No intake/output data recorded.  Today's Vitals   04/05/16 2231 04/05/16 2232 04/05/16 2242 04/05/16 2300  BP:  139/79  140/78  Pulse: 66 67  71  Resp:      Temp:   98.4 F (36.9 C)   TempSrc:   Axillary   SpO2:    100%  Weight:      Height:      PainSc:       FHT: BL 135 w/ moderate variability, +accels, earlys, lates at 2315, 2316 and 2317 UC:   irregular, every 1-2 minutes SVE: 4/95/-1, some molding; head well applied to cvx; adequate for TOL  Pitocin at 2 mU/min IUPC placed w/o difficulty @ 2324  Assessment:  IUP at term Latent labor Tachysystole Cat 2 FHRT - overall reassuring preE sxs  Plan: Repeat preE labs and PCR. Reiterated that she will feel vaginal pressure throughout process. Encouraged rest. Tylenol prn. Will decrease Pitocin to 1 mU/min and maintain MVUs between 200-250. Consult as indicated.  Sherre ScarletWILLIAMS, Braxson Hollingsworth CNM 04/05/2016, 11:39 PM

## 2016-04-05 NOTE — Progress Notes (Addendum)
Assuming care of Carmen Harper, 20 yo G1P0 @ 41.0 wks, IOL due to late term pregnancy. FOB at bedside.  Subjective: Rates pain as 7/10 - states no relief from IV pain medication; desires Nitrous Oxide, then Epidural between 10-11 PM. Requests no interventions until comfortable w/ epidural. Feeling a lot of pressure; unable to have BM, but did have one earlier today. +FM. Denies h/a, visual changes, epigastric pain, difficulty breathing, VB or LOF.    States h/o asthma, but has not used rescue inhaler in 6 months, last used Qvar inhaler 2 days ago. States no need for inhaler at this time.  States anxiety only related to induction process. Requests IV anxiety med prior to epidural as she is unable to swallow pills.    Objective: BP 129/69   Pulse 73   Temp 98.2 F (36.8 C) (Oral)   Resp 18   Ht 5\' 7"  (1.702 m)   Wt 100.7 kg (222 lb)   LMP 06/23/2015   BMI 34.77 kg/m  No intake/output data recorded. No intake/output data recorded.  Today's Vitals   04/05/16 1832 04/05/16 1901 04/05/16 1931 04/05/16 2009  BP: 138/77 136/80 137/86 129/69  Pulse: 66 66 62 73  Resp:  18    Temp:    98.2 F (36.8 C)  TempSrc:    Oral  Weight:      Height:      PainSc: 2    7    Gen: Anxious Lungs: CTAB CV: RRR w/o M/R/G Abdomen: gravid, soft between ctxs, non distended Cephalic by Leopold's, EFW 7 3/4 - 8 lbs FHT: BL 122 w/ moderate variability, +accels, very mild, non-persistent variables, occ early, no lates UC:   irregular, every 1-3 minutes SVE:   Dilation: 2 Effacement (%): 50 Station: -2 Exam by:: Carmen Arbouresmaine Lewis, RN@ 20:17 Pitocin at 2 mU/min  Received PCN loading dose at 17:32 PM  Assessment:  IUP at 41.0 wks IOL due to late term pregnancy Latent labor GBS positive Asthmatic Acute moderate-severe procedural anxiety Intermittent elevated BPs w/ normal preE labs and neg PCR Tachysystole w/ overall reassuring FHRT  Plan: Anti-hypertensive(s) for severe range pressures Continue  PCN for GBS+ prophylaxis Treatment of procedural anxiety Maintain Pitocin at current rate AROM when comfortable w/ epidural Supportive care Expect progress and SVD  Carmen Harper, Carmen Harper CNM 04/05/2016, 8:37 PM   ADDENDUM:  SROM, clear fluid at 21:21. Cvx now 3 cm.  Carmen ScarletKimberly Aydn Harper, CNM 04/05/16, 9:39 PM

## 2016-04-05 NOTE — Progress Notes (Signed)
Carmen Harper is a 20 y.o. G1P0 at 4273w0d  admitted for induction of labor due to Post dates. .  Subjective: Feeling some cramping. + FM no lof no vaginal bleeding. Headache upon arrival improving after tylenol   Objective: BP 136/82   Pulse 70   Temp 97.6 F (36.4 C) (Oral)   Ht 5\' 7"  (1.702 m)   Wt 222 lb (100.7 kg)   LMP 06/23/2015   BMI 34.77 kg/m  No intake/output data recorded. No intake/output data recorded.  FHT:  Category 1 UC:   Irregular  SVE:   Dilation: Fingertip Effacement (%): 50 Station: -2 Exam by:: Latham,cnm  Labs: Lab Results  Component Value Date   WBC 8.0 04/05/2016   HGB 10.6 (L) 04/05/2016   HCT 32.4 (L) 04/05/2016   MCV 78.6 04/05/2016   PLT 204 04/05/2016    Assessment / Plan: 41 wks and 0 days for induction Continue Cytotec  GBS Positive  Plan Penicillin in active labor or with ROM.  Elevated BP on arrival ( mild Range) - PIH labs negative PCR pending... BP currently normal - Will continue to monitor  Charles Andringa J. 04/05/2016, 8:08 AM

## 2016-04-06 ENCOUNTER — Encounter (HOSPITAL_COMMUNITY): Payer: Self-pay | Admitting: *Deleted

## 2016-04-06 LAB — COMPREHENSIVE METABOLIC PANEL
ALK PHOS: 125 U/L (ref 38–126)
ALT: 19 U/L (ref 14–54)
ANION GAP: 7 (ref 5–15)
AST: 34 U/L (ref 15–41)
Albumin: 2.4 g/dL — ABNORMAL LOW (ref 3.5–5.0)
BILIRUBIN TOTAL: 0.4 mg/dL (ref 0.3–1.2)
BUN: 9 mg/dL (ref 6–20)
CALCIUM: 8.5 mg/dL — AB (ref 8.9–10.3)
CO2: 22 mmol/L (ref 22–32)
CREATININE: 0.58 mg/dL (ref 0.44–1.00)
Chloride: 108 mmol/L (ref 101–111)
Glucose, Bld: 92 mg/dL (ref 65–99)
Potassium: 4.1 mmol/L (ref 3.5–5.1)
SODIUM: 137 mmol/L (ref 135–145)
TOTAL PROTEIN: 5.1 g/dL — AB (ref 6.5–8.1)

## 2016-04-06 LAB — LACTATE DEHYDROGENASE: LDH: 255 U/L — AB (ref 98–192)

## 2016-04-06 LAB — URIC ACID: URIC ACID, SERUM: 5.5 mg/dL (ref 2.3–6.6)

## 2016-04-06 MED ORDER — ACETAMINOPHEN 160 MG/5ML PO SOLN
325.0000 mg | Freq: Four times a day (QID) | ORAL | Status: DC | PRN
  Filled 2016-04-06: qty 20.3

## 2016-04-06 MED ORDER — ALBUTEROL SULFATE (2.5 MG/3ML) 0.083% IN NEBU: 3.0000 mL | INHALATION_SOLUTION | Freq: Four times a day (QID) | RESPIRATORY_TRACT | Status: DC | PRN

## 2016-04-06 MED ORDER — PRENATAL MULTIVITAMIN CH: 1.0000 | ORAL_TABLET | Freq: Every day | ORAL | Status: DC

## 2016-04-06 MED ORDER — WITCH HAZEL-GLYCERIN EX PADS: 1.0000 "application " | MEDICATED_PAD | CUTANEOUS | Status: DC | PRN

## 2016-04-06 MED ORDER — DIBUCAINE 1 % RE OINT: 1.0000 "application " | TOPICAL_OINTMENT | RECTAL | Status: DC | PRN

## 2016-04-06 MED ORDER — ONDANSETRON HCL 4 MG/2ML IJ SOLN: 4.0000 mg | INTRAMUSCULAR | Status: DC | PRN

## 2016-04-06 MED ORDER — ONDANSETRON HCL 4 MG PO TABS: 4.0000 mg | ORAL_TABLET | ORAL | Status: DC | PRN

## 2016-04-06 MED ORDER — ZOLPIDEM TARTRATE 5 MG PO TABS: 5.0000 mg | ORAL_TABLET | Freq: Every evening | ORAL | Status: DC | PRN

## 2016-04-06 MED ORDER — COCONUT OIL OIL
1.0000 | TOPICAL_OIL | Status: DC | PRN
Start: 2016-04-06 — End: 2016-04-08
  Administered 2016-04-07: 1 via TOPICAL
  Filled 2016-04-06: qty 120

## 2016-04-06 MED ORDER — OXYCODONE-ACETAMINOPHEN 5-325 MG/5ML PO SOLN: 5.0000 mL | Freq: Four times a day (QID) | ORAL | Status: DC | PRN

## 2016-04-06 MED ORDER — TETANUS-DIPHTH-ACELL PERTUSSIS 5-2.5-18.5 LF-MCG/0.5 IM SUSP: 0.5000 mL | Freq: Once | INTRAMUSCULAR | Status: DC

## 2016-04-06 MED ORDER — BUDESONIDE 0.25 MG/2ML IN SUSP
0.2500 mg | Freq: Two times a day (BID) | RESPIRATORY_TRACT | Status: DC | PRN
Start: 2016-04-06 — End: 2016-04-06
  Filled 2016-04-06: qty 2

## 2016-04-06 MED ORDER — SIMETHICONE 80 MG PO CHEW: 80.0000 mg | CHEWABLE_TABLET | ORAL | Status: DC | PRN

## 2016-04-06 MED ORDER — DIPHENHYDRAMINE HCL 25 MG PO CAPS: 25.0000 mg | ORAL_CAPSULE | Freq: Four times a day (QID) | ORAL | Status: DC | PRN

## 2016-04-06 MED ORDER — FERROUS SULFATE 300 (60 FE) MG/5ML PO SYRP
300.0000 mg | ORAL_SOLUTION | Freq: Two times a day (BID) | ORAL | Status: DC
  Administered 2016-04-06 – 2016-04-07 (×4): 300 mg via ORAL
  Filled 2016-04-06 (×5): qty 5

## 2016-04-06 MED ORDER — BUDESONIDE 0.25 MG/2ML IN SUSP
0.2500 mg | Freq: Two times a day (BID) | RESPIRATORY_TRACT | Status: DC | PRN
  Filled 2016-04-06: qty 2

## 2016-04-06 MED ORDER — ACETAMINOPHEN 160 MG/5ML PO SOLN
650.0000 mg | ORAL | Status: DC | PRN
  Administered 2016-04-07: 650 mg via ORAL

## 2016-04-06 MED ORDER — MISOPROSTOL 200 MCG PO TABS
ORAL_TABLET | ORAL | Status: AC
  Filled 2016-04-06: qty 5

## 2016-04-06 MED ORDER — IBUPROFEN 100 MG/5ML PO SUSP
600.0000 mg | Freq: Four times a day (QID) | ORAL | Status: DC | PRN
  Administered 2016-04-06 – 2016-04-08 (×8): 600 mg via ORAL
  Filled 2016-04-06 (×10): qty 30

## 2016-04-06 MED ORDER — OXYCODONE HCL 5 MG/5ML PO SOLN
5.0000 mg | Freq: Four times a day (QID) | ORAL | Status: DC | PRN
  Administered 2016-04-06 – 2016-04-07 (×5): 5 mg via ORAL
  Filled 2016-04-06 (×5): qty 5

## 2016-04-06 MED ORDER — FERROUS SULFATE 220 (44 FE) MG/5ML PO ELIX: 220.0000 mg | ORAL_SOLUTION | Freq: Two times a day (BID) | ORAL | Status: DC

## 2016-04-06 MED ORDER — BENZOCAINE-MENTHOL 20-0.5 % EX AERO
1.0000 "application " | INHALATION_SPRAY | CUTANEOUS | Status: DC | PRN
  Administered 2016-04-07: 1 via TOPICAL
  Filled 2016-04-06: qty 56

## 2016-04-06 MED ORDER — SENNOSIDES-DOCUSATE SODIUM 8.6-50 MG PO TABS
2.0000 | ORAL_TABLET | ORAL | Status: DC
  Filled 2016-04-06 (×3): qty 2

## 2016-04-06 NOTE — Lactation Note (Addendum)
This note was copied from a baby's chart. Lactation Consultation Note  Patient Name: Carmen Harper Reason for consult: Initial assessment  Initial visit at 13 hours of life. Mom feels that breastfeeding is going well. Infant was finishing a feeding as I was beginning to talk w/Mom. Mom had originally planned to just breastfeed for 2 weeks, but I encouraged her to continue for longer.   Mom reports + breast changes w/pregnancy. 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   Mom made aware of O/P services, breastfeeding support groups, community resources, and our phone # for post-discharge questions.   Carmen Harper, Carm[facility] Harper [facility], 2:39 PM

## 2016-04-06 NOTE — Anesthesia Postprocedure Evaluation (Signed)
Anesthesia Post Note  Patient: Carmen Harper  Procedure(s) Performed: * No procedures listed *  Patient location during evaluation: Mother Baby Anesthesia Type: Epidural Level of consciousness: awake, awake and alert, oriented and patient cooperative Pain management: pain level controlled Vital Signs Assessment: post-procedure vital signs reviewed and stable Respiratory status: spontaneous breathing, nonlabored ventilation and respiratory function stable Cardiovascular status: stable Postop Assessment: no headache, no backache, no signs of nausea or vomiting and patient able to bend at knees Anesthetic complications: no     Last Vitals:  Vitals:   04/06/16 0459 04/06/16 0840  BP: 129/70 125/65  Pulse: 66 68  Resp: 17 16  Temp: 36.5 C 36.9 C    Last Pain:  Vitals:   04/06/16 0840  TempSrc: Oral  PainSc: 2    Pain Goal: Patients Stated Pain Goal: 2 (04/06/16 0840)               Roth Ress L

## 2016-04-06 NOTE — Progress Notes (Signed)
UR chart review completed.  

## 2016-04-06 NOTE — Progress Notes (Signed)
  Subjective: C/O more pressure in rectum despite PCA dose.  Objective: BP 117/67   Pulse 71   Temp 98.4 F (36.9 C) (Axillary)   Resp 18   Ht 5\' 7"  (1.702 m)   Wt 100.7 kg (222 lb)   LMP 06/23/2015   SpO2 100%   BMI 34.77 kg/m  Today's Vitals   04/05/16 2242 04/05/16 2300 04/05/16 2330 04/06/16 0000  BP:  140/78 134/86 117/67  Pulse:  71 71 71  Resp:      Temp: 98.4 F (36.9 C)     TempSrc: Axillary     SpO2:  100% 100%   Weight:      Height:      PainSc:       FHT: BL 140 w/ moderate variability, +lates - 3 in a row, earlys UC:   irregular, every 1-2 minutes SVE: C/C per RN  Pitocin at 1 mU/min  Assessment:  2nd stage labor GBS positive Cat 2 FHRT  Plan: D/C Pitocin. Institute other maternal resuscitative measures. Begin pushing. Consult as indicated. Expect SVD.  Sherre ScarletWILLIAMS, Carmen Camino CNM 04/06/2016, 12:32 AM

## 2016-04-07 DIAGNOSIS — F419 Anxiety disorder, unspecified: Secondary | ICD-10-CM | POA: Diagnosis present

## 2016-04-07 MED ORDER — DIPHENHYDRAMINE HCL 12.5 MG/5ML PO ELIX
25.0000 mg | ORAL_SOLUTION | Freq: Every evening | ORAL | Status: DC | PRN
  Filled 2016-04-07 (×2): qty 10

## 2016-04-07 MED ORDER — DIPHENHYDRAMINE HCL 12.5 MG/5ML PO ELIX
50.0000 mg | ORAL_SOLUTION | Freq: Every day | ORAL | Status: DC
  Administered 2016-04-07: 50 mg via ORAL
  Filled 2016-04-07: qty 20

## 2016-04-07 NOTE — Progress Notes (Signed)
Pt c/o not feeling well, b/p taken vitals 131/76, 74, 18 resps, Patient very upset over infant's current health status. Infant in nursery for observation at present. Encouraged mom to eat and to try to take a nap. Pt ordering food at present.

## 2016-04-07 NOTE — Progress Notes (Signed)
Post Partum Day 1 Subjective:  Well. Lochia are normal. Voiding, ambulating, tolerating normal diet. nursing going well.  Objective: Blood pressure 139/76, pulse 71, temperature 98 F (36.7 C), temperature source Oral, resp. rate 18, height 5\' 7"  (1.702 m), weight 222 lb (100.7 kg), last menstrual period 06/23/2015, SpO2 100 %, unknown if currently breastfeeding.  Physical Exam:  General: normal Lochia: appropriate Uterine Fundus: 0/1 firm non-tender  Extremities: No evidence of DVT seen on physical exam. Edema 1+     Recent Labs  04/05/16 0415 04/05/16 2139  HGB 10.6* 9.6*  HCT 32.4* 29.9*    Assessment/Plan: Normal Post-partum. Continue routine post-partum care. Anticipate discharge tomorrow Baby girl: Carmen Harper Breastfeeding Desires Micronor for contraception    LOS: 2 days   Carmen Harper A MD 04/07/2016, 7:22 AM

## 2016-04-07 NOTE — Lactation Note (Signed)
This note was copied from a baby's chart. Lactation Consultation Note  Patient Name: Carmen Harper ZOXWR'UToday's Date: 04/07/2016 Reason for consult: Follow-up assessment;Infant weight loss (LC encouraged mom to page with feeding cues latch score )  Per mom baby last fed at 1400 for 30 mins , presently asleep in bedside crib. LC updated feedings in Epic and reviewed basics  With mom. LC showed mom how to hand express with only glistening of EBM on nipples. LC noted areola edema bilaterally, and instructed \ Mom on the use breast shells for reverse pressure between feedings. Also hand pump #24 Flange ok for today, but provided #27 flange for when the  Milk comes in. Per mom not active with WIC , [place] in [place] and LC recommended on Medicaid [facility] and showed her  The resource sheet with phone number. Per mom not sure how long she will breast feed, but is very pleased with how the baby is doing so far.  LC reviewed the benefits of breast feeding.  LC encouraged mom to call with feeding cues for Latch score and feeding assessment.  MBU RN Alfonzo FellerHeather Gaither aware mom will call.   Maternal Data    Feeding Feeding Type: Breast Fed Length of feed: 30 min (per mom )  LATCH Score/Interventions                      Lactation Tools Discussed/Used Tools: Shells;Pump;Flanges Flange Size: 27 Shell Type: Inverted (LC recommended using  shells between feedings, areola edema ) Breast pump type: Manual WIC Program: No (LC encouarged [facility] [place] )   Consult Status Consult Status: Follow-up Date: 04/08/16 Follow-up type: In-patient    [name], Zayed Griffie Ann 04/07/2016, 3:28 PM

## 2016-04-07 NOTE — Progress Notes (Signed)
In to see patient--she is very anxious, hasn't slept, hasn't eaten.  Family also very anxious. [facility] due to episodes of lower HR, but normal O2 sats throughout.  [facility], cardiologist has released baby to rooming in  Encouraged patient to eat, rest, and be reassured regarding close care of baby. Recommended anti-anxiety med to help with sleep/rest--patient "can't swallow pills". Will order Benadryl 50 mg at hs.  Anticipate maternal d/c tomorrow--will await peds decision regarding baby.  Advised patient of "baby patient" option.  Support to patient and family for anxieties.  Nigel BridgemanVicki Maythe Deramo, CNM 04/07/16 8:30p

## 2016-04-07 NOTE — Discharge Instructions (Signed)

## 2016-04-07 NOTE — Discharge Summary (Signed)
[facility] Discharge Summary   Patient Name:   Carmen Harper DOB:     1995/11/20 MRN:     [id number]  Date of Admission:   04/05/2016 Date of Discharge:  04/08/2016  Admitting diagnosis:    INDUCTION Principal Problem:   Vaginal delivery Active Problems:   Second-degree perineal laceration, with delivery   Anxiety      Discharge diagnosis:    INDUCTION Principal Problem:   Vaginal delivery Active Problems:   Second-degree perineal laceration, with delivery   Anxiety                                           Post partum procedures: NA  Type of Delivery:  SVB  Delivering Provider: Sherre Scarlet   Date of Delivery:  04/06/16  Newborn Data:    Live born female  Birth Weight: 7 lb 9.7 oz (3450 g) APGAR: 6, 8  Baby's Name:   Carmen Harper Baby Feeding:   Breast Disposition:   Anticipated to go home with mother  Complications:   None  Hospital course:      Induction of Labor With Vaginal Delivery   20 y.o. yo G1P1001 at [redacted]w[redacted]d was admitted to the hospital 04/05/2016 for induction of labor.  Indication for induction: Postdates.  Patient had an uncomplicated labor course as follows: Membrane Rupture Time/Date: 9:21 PM ,04/05/2016   Intrapartum Procedures: Episiotomy: None [1]                                         Lacerations:  3rd degree [4];Vaginal [6]  Patient had delivery of a Viable infant.  Information for the patient's newborn:  Carmen Harper [[id number]]  Delivery Method: Vag-Spont   04/06/2016  Details of delivery can be found in separate delivery note.  Patient had a routine postpartum course. Patient is discharged home 04/08/16.  Baby had episodes of bradycardia, but maintained normal O2 sats.  Held in [address] for a period of observation on day 1, and evaluated by cardiology.  Released to rooming in with mother on evening of day 1.  Will be evaluated on day 2 for d/c with mother.  Patient has significant anxiety--SW consult ordered  prior to d/c.   Physical Exam:   Vitals:   04/06/16 1808 04/07/16 0550 04/07/16 1945 04/08/16 0612  BP: 120/71 139/76 131/76 123/67  Pulse: 94 71 74 63  Resp: 16 18 18 18   Temp: 97.8 F (36.6 C) 98 F (36.7 C) 98.3 F (36.8 C) 98.5 F (36.9 C)  TempSrc: Oral Oral Oral   SpO2: 100%     Weight:      Height:       General: alert Lochia: appropriate Uterine Fundus: firm Incision: Healing well with no significant drainage DVT Evaluation: No evidence of DVT seen on physical exam. Negative Homan's sign.  Labs:  CBC Latest Ref Rng & Units 04/05/2016 04/05/2016 02/25/2016  WBC 4.0 - 10.5 K/uL 13.3(H) 8.0 8.9  Hemoglobin 12.0 - 15.0 g/dL 8.6(V) 10.6(L) 10.3(L)  Hematocrit 36.0 - 46.0 % 29.9(L) 32.4(L) 31.1(L)  Platelets 150 - 400 K/uL 189 204 190     Discharge instruction: per After Visit Summary and "Baby and Me Booklet".  After Visit Meds:    Medication List    STOP  taking these medications   diphenhydrAMINE 25 mg capsule Commonly known as:  BENADRYL   ondansetron 8 MG disintegrating tablet Commonly known as:  ZOFRAN ODT   oxyCODONE-acetaminophen 5-325 MG/5ML solution Commonly known as:  ROXICET     TAKE these medications   acetaminophen 160 MG/5ML solution Commonly known as:  TYLENOL Take 10.2-20.3 mLs (325-650 mg total) by mouth every 6 (six) hours as needed for moderate pain.   beclomethasone 80 MCG/ACT inhaler Commonly known as:  QVAR Inhale 2 puffs into the lungs 2 (two) times daily.   ferrous sulfate 300 (60 Fe) MG/5ML syrup Take 5 mLs (300 mg total) by mouth daily with breakfast.   ibuprofen 100 MG/5ML suspension Commonly known as:  ADVIL,MOTRIN Take 30 mLs (600 mg total) by mouth every 6 (six) hours as needed for mild pain or moderate pain.   multivitamin-prenatal 27-0.8 MG Tabs tablet Take 1 tablet by mouth daily at 12 noon.   norethindrone 0.35 MG tablet Commonly known as:  ORTHO MICRONOR Take 1 tablet (0.35 mg total) by mouth daily. Start  taking on:  04/26/2016   oxyCODONE 5 MG/5ML solution Commonly known as:  ROXICODONE Take 5 mLs (5 mg total) by mouth every 6 (six) hours as needed for moderate pain or severe pain.   VENTOLIN HFA 108 (90 Base) MCG/ACT inhaler Generic drug:  albuterol Inhale 1-2 puffs into the lungs every 6 (six) hours as needed for wheezing or shortness of breath.       Diet: routine diet  Activity: Advance as tolerated. Pelvic rest for 6 weeks.   Outpatient follow up:6 weeks Follow up Appt:No future appointments. Follow up visit: No Follow-up on file.  Postpartum contraception: Progesterone only pills  04/08/2016 Nigel BridgemanLATHAM, Ekam Besson, CNM

## 2016-04-08 MED ORDER — ACETAMINOPHEN 160 MG/5ML PO SOLN: 325.0000 mg | Freq: Four times a day (QID) | ORAL | 0 refills | Status: DC | PRN

## 2016-04-08 MED ORDER — NORETHINDRONE 0.35 MG PO TABS
1.0000 | ORAL_TABLET | Freq: Every day | ORAL | 11 refills | Status: DC
Start: 2016-04-26 — End: 2016-09-17

## 2016-04-08 MED ORDER — IBUPROFEN 100 MG/5ML PO SUSP: 600.0000 mg | Freq: Four times a day (QID) | ORAL | 2 refills | Status: DC | PRN

## 2016-04-08 MED ORDER — FERROUS SULFATE 300 (60 FE) MG/5ML PO SYRP: 300.0000 mg | ORAL_SOLUTION | Freq: Every day | ORAL | 3 refills | Status: DC

## 2016-04-08 MED ORDER — OXYCODONE HCL 5 MG/5ML PO SOLN: 5.0000 mg | Freq: Four times a day (QID) | ORAL | 0 refills | Status: DC | PRN

## 2016-04-08 NOTE — Clinical Social Work Maternal (Signed)
CLINICAL SOCIAL WORK MATERNAL/CHILD NOTE  Patient Details  Name: Carmen Harper MRN: [id number] Date of Birth: 12/23/1995  Date:  04/08/2016  Clinical Social Worker Initiating Note:  [name], LCSW            Date/ Time Initiated:  04/08/16/0940                        Child's Name:  Carmen Harper   Legal Guardian:  Mother   Need for Interpreter:  None   Date of Referral:  04/08/16     Reason for Referral:  Other (Comment) (currently anxious, hx of anxiety in pregnancy)   Referral Source:  RN   Address:     Phone number:      Household Members: Significant Other, Parents   Natural Supports (not living in the home): Extended Family, Friends, Spouse/significant other, Parent   Professional Supports:None   Employment:Full-time   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  LCSW met with [name] at length in room to explain reason for consult and role/services while in hospital.  [name] reports hx of anxiety throughout pregnancy due to misinformation thinking she was having a boy, then twins, then triplets to now a single girl.  Reports she had difficulty with HR with baby during pregnancy and baby did not move a lot along with after birth baby having difficulty regulating HR 70-120s.  Reports she has a hx of family members with HR difficulty and one member who died due to  hear complications.  She reports she loves her baby and wants only the best and for her to be healthy, thus her anxiety has been higher.  LCSW validated all of MOBs concerns as well as understanding of anxiety.  She reports she feels reassured with the last 24 hours of labs and monitoring.  LCSW discussed other community resources to assist [name] with reassurance and monitoring and will refer [name] to [facility] and [facility] for continued care. She reports she has a Pediatrician picked out and family connects will also come to home. She has a strong support system, living with her parents and significant other.    LCSW spent time educating [name] on PPD and PPA with regards to symptoms and plans if they arise.  [name] was given a check list to maintain a watch on her emotional state, make sure she is eating and drinking adequately and taking care of herself in effort to reduce PPD.  [name] was also given support groups information.  At this time she denies need for formalized referral for therapy or medication. She is aware of call her OB or [facility] nurse if needs arise or feeling overly sad and tearful or not bonding with the baby.  [name] reports she is discharging today and excited to go home.  Reports she has no other needs or concerns at this time and home is prepared   for baby. Last area of education discussed was safe sleep and co-sleeping safety. [name] reports baby will sleep in a bassinet and then her crib once she gets a little older.  No other needs or concerns noted by this writer.    CSW Plan/Description: Information/Referral to Community Resources , Patient/Family Education , No Further Intervention Required/No Barriers to Discharge  LCSW will make [facility] and [facility] referral.   Alecsander Hattabaugh N, LCSW 04/08/2016, 9:41 AM  

## 2016-09-17 ENCOUNTER — Emergency Department (HOSPITAL_BASED_OUTPATIENT_CLINIC_OR_DEPARTMENT_OTHER)
Admission: EM | Admit: 2016-09-17 | Discharge: 2016-09-17 | Disposition: A | Discharge status: Home / Self Care | Payer: Medicaid Other | Attending: Emergency Medicine | Admitting: Emergency Medicine

## 2016-09-17 ENCOUNTER — Encounter (HOSPITAL_BASED_OUTPATIENT_CLINIC_OR_DEPARTMENT_OTHER): Payer: Self-pay | Admitting: *Deleted

## 2016-09-17 DIAGNOSIS — J45909 Unspecified asthma, uncomplicated: Secondary | ICD-10-CM | POA: Diagnosis not present

## 2016-09-17 DIAGNOSIS — X58XXXA Exposure to other specified factors, initial encounter: Secondary | ICD-10-CM | POA: Insufficient documentation

## 2016-09-17 DIAGNOSIS — Y9389 Activity, other specified: Secondary | ICD-10-CM | POA: Insufficient documentation

## 2016-09-17 DIAGNOSIS — Y999 Unspecified external cause status: Secondary | ICD-10-CM | POA: Insufficient documentation

## 2016-09-17 DIAGNOSIS — Z791 Long term (current) use of non-steroidal anti-inflammatories (NSAID): Secondary | ICD-10-CM | POA: Insufficient documentation

## 2016-09-17 DIAGNOSIS — S0502XA Injury of conjunctiva and corneal abrasion without foreign body, left eye, initial encounter: Secondary | ICD-10-CM

## 2016-09-17 DIAGNOSIS — Y929 Unspecified place or not applicable: Secondary | ICD-10-CM | POA: Diagnosis not present

## 2016-09-17 MED ORDER — KETOROLAC TROMETHAMINE 0.5 % OP SOLN: 1.0000 [drp] | Freq: Four times a day (QID) | OPHTHALMIC | 0 refills | Status: DC

## 2016-09-17 MED ORDER — FLUORESCEIN SODIUM 0.6 MG OP STRP
1.0000 | ORAL_STRIP | Freq: Once | OPHTHALMIC | Status: AC
  Administered 2016-09-17: 1 via OPHTHALMIC
  Filled 2016-09-17: qty 1

## 2016-09-17 MED ORDER — TETRACAINE HCL 0.5 % OP SOLN
2.0000 [drp] | Freq: Once | OPHTHALMIC | Status: AC
  Administered 2016-09-17: 2 [drp] via OPHTHALMIC
  Filled 2016-09-17: qty 4

## 2016-09-17 MED ORDER — KETOROLAC TROMETHAMINE 0.5 % OP SOLN
1.0000 [drp] | Freq: Four times a day (QID) | OPHTHALMIC | Status: DC | PRN
  Filled 2016-09-17: qty 3

## 2016-09-17 MED ORDER — ERYTHROMYCIN 5 MG/GM OP OINT
TOPICAL_OINTMENT | Freq: Four times a day (QID) | OPHTHALMIC | Status: DC
  Administered 2016-09-17: 15:00:00 via OPHTHALMIC
  Filled 2016-09-17: qty 3.5
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
## 2016-10-28 ENCOUNTER — Emergency Department (HOSPITAL_BASED_OUTPATIENT_CLINIC_OR_DEPARTMENT_OTHER): Payer: Medicaid Other

## 2016-10-28 ENCOUNTER — Emergency Department (HOSPITAL_BASED_OUTPATIENT_CLINIC_OR_DEPARTMENT_OTHER)
Admission: EM | Admit: 2016-10-28 | Discharge: 2016-10-28 | Disposition: A | Discharge status: Home / Self Care | Payer: Medicaid Other | Attending: Emergency Medicine | Admitting: Emergency Medicine

## 2016-10-28 ENCOUNTER — Encounter (HOSPITAL_BASED_OUTPATIENT_CLINIC_OR_DEPARTMENT_OTHER): Payer: Self-pay | Admitting: Emergency Medicine

## 2016-10-28 DIAGNOSIS — Y999 Unspecified external cause status: Secondary | ICD-10-CM | POA: Diagnosis not present

## 2016-10-28 DIAGNOSIS — M545 Low back pain: Secondary | ICD-10-CM | POA: Diagnosis not present

## 2016-10-28 DIAGNOSIS — Y9389 Activity, other specified: Secondary | ICD-10-CM | POA: Insufficient documentation

## 2016-10-28 DIAGNOSIS — W010XXA Fall on same level from slipping, tripping and stumbling without subsequent striking against object, initial encounter: Secondary | ICD-10-CM | POA: Insufficient documentation

## 2016-10-28 DIAGNOSIS — J45909 Unspecified asthma, uncomplicated: Secondary | ICD-10-CM | POA: Diagnosis not present

## 2016-10-28 DIAGNOSIS — Y929 Unspecified place or not applicable: Secondary | ICD-10-CM | POA: Diagnosis not present

## 2016-10-28 DIAGNOSIS — S60512A Abrasion of left hand, initial encounter: Secondary | ICD-10-CM | POA: Diagnosis not present

## 2016-10-28 DIAGNOSIS — S299XXA Unspecified injury of thorax, initial encounter: Secondary | ICD-10-CM | POA: Diagnosis present

## 2016-10-28 DIAGNOSIS — W19XXXA Unspecified fall, initial encounter: Secondary | ICD-10-CM

## 2016-10-28 MED ORDER — HYDROCODONE-ACETAMINOPHEN 5-325 MG PO TABS
1.0000 | ORAL_TABLET | Freq: Once | ORAL | Status: AC
  Administered 2016-10-28: 1 via ORAL
  Filled 2016-10-28: qty 1

## 2016-10-28 MED ORDER — BACITRACIN ZINC 500 UNIT/GM EX OINT: TOPICAL_OINTMENT | Freq: Two times a day (BID) | CUTANEOUS | Status: DC

## 2016-10-28 NOTE — ED Triage Notes (Signed)
Pt states she was moving and tripped over a box, twisted right ankle, hurt back and has lac on left hand

## 2016-10-28 NOTE — ED Provider Notes (Addendum)
[facility]   CSN: [id number] Arrival date & time: 10/28/16  1334     History   Chief Complaint Chief Complaint  Patient presents with  . Fall    HPI Carmen Harper is a 21 y.o. female.Patient fell while carrying a box 1 hour 20 minutes prior to coming here. She is presently moving. She did not trip over a box. She complains of low nonradiating back pain that she landed flat on her back also complains of left hand pain at site of wound and right ankle and right foot pain since the event. Pain is worse with changing positions or with weightbearing improved with nonweight bearing or remaining stationary. She felt well prior to the event. No other associated symptoms. No treatment prior to coming here.  HPI  Past Medical History:  Diagnosis Date  . Asthma     Patient Active Problem List   Diagnosis Date Noted  . Anxiety 04/07/2016  . Vaginal delivery 04/06/2016  . Second-degree perineal laceration, with delivery 04/06/2016  . Premature beats 04/05/2016  . Positive GBS test 04/04/2016  . Asthma 04/04/2016    Past Surgical History:  Procedure Laterality Date  . breast tumor       OB History    Gravida Para Term Preterm AB Living   1 1 1     1    SAB TAB Ectopic Multiple Live Births         0 1       Home Medications    Prior to Admission medications   Medication Sig Start Date End Date Taking? Authorizing Provider  albuterol (VENTOLIN HFA) 108 (90 Base) MCG/ACT inhaler Inhale 1-2 puffs into the lungs every 6 (six) hours as needed for wheezing or shortness of breath.     Historical Provider, MD  baclofen (LIORESAL) 10 MG tablet Take 10 mg by mouth 3 (three) times daily.    Historical Provider, MD  beclomethasone (QVAR) 80 MCG/ACT inhaler Inhale 2 puffs into the lungs 2 (two) times daily.     Historical Provider, MD  ketorolac (ACULAR) 0.5 % ophthalmic solution Place 1 drop into both eyes every 6 (six) hours. 09/17/16   Benjiman Core,  MD  meloxicam (MOBIC) 15 MG tablet Take 15 mg by mouth daily.    Historical Provider, MD    Family History Family History  Problem Relation Age of Onset  . Alcohol abuse Neg Hx   . Arthritis Neg Hx   . Asthma Neg Hx   . Birth defects Neg Hx   . Cancer Neg Hx   . COPD Neg Hx   . Depression Neg Hx   . Diabetes Neg Hx   . Drug abuse Neg Hx   . Early death Neg Hx   . Hearing loss Neg Hx   . Heart disease Neg Hx   . Hyperlipidemia Neg Hx   . Hypertension Neg Hx   . Kidney disease Neg Hx   . Learning disabilities Neg Hx   . Mental illness Neg Hx   . Mental retardation Neg Hx   . Miscarriages / Stillbirths Neg Hx   . Stroke Neg Hx   . Vision loss Neg Hx   . Varicose Veins Neg Hx     Social History Social History  Substance Use Topics  . Smoking status: Never Smoker  . Smokeless tobacco: Never Used  . Alcohol use No     Allergies   Sulfa antibiotics   Review of Systems  Review of Systems  Constitutional: Negative.   HENT: Negative.   Respiratory: Negative.   Cardiovascular: Negative.   Gastrointestinal: Negative.   Musculoskeletal: Positive for arthralgias and back pain.  Skin: Positive for wound.       Wound and palmar surface of left hand  Allergic/Immunologic:       Current on Tdap immunization  Neurological: Negative.   Psychiatric/Behavioral: Negative.   All other systems reviewed and are negative.    Physical Exam Updated Vital Signs BP 121/79   Pulse 89   Temp 98.1 F (36.7 C) (Oral)   Resp 18   Ht 5\' 7"  (1.702 m)   Wt 190 lb (86.2 kg)   LMP 10/02/2016   SpO2 100%   BMI 29.76 kg/m   Physical Exam  Constitutional: She is oriented to person, place, and time.  Alert Glasgow Coma Score 15 appears mildly uncomfortable  HENT:  Head: Normocephalic and atraumatic.  Eyes: Conjunctivae are normal. Pupils are equal, round, and reactive to light.  Neck: Neck supple. No tracheal deviation present. No thyromegaly present.  No tenderness    Cardiovascular: Normal rate and regular rhythm.   No murmur heard. Pulmonary/Chest: Effort normal and breath sounds normal.  Abdominal: Soft. Bowel sounds are normal. She exhibits no distension. There is no tenderness.  Musculoskeletal: Normal range of motion. She exhibits no edema or tenderness.  Left upper extremity dime-sized abrasion overlying distal hand palmar surface immediately proximal to fifth finger with corresponding tenderness. Good capillary refill. Will arrange of motion of fifth finger secondary to pain. No deformity. Right lower extremity skin intact. Tender over lateral and medial malleolus. No soft tissue swelling. Mildly tender over proximal dorsal lateral foot with minimal soft tissue swelling. Good capillary refill. DP pulse 2+. Negative Thompson's test otherwise atraumatic. All other extremities without contusion abrasion or tenderness neurovascularly intact. She is mildly diffusely tender over lumbar spine. Thoracic and cervical spine nontender. Pelvis stable nontender.  Neurological: She is alert and oriented to person, place, and time. Coordination normal.  Moves all extremities well.  Skin: Skin is warm and dry. No rash noted.  Psychiatric: She has a normal mood and affect.  Nursing note and vitals reviewed.    ED Treatments / Results  Labs (all labs ordered are listed, but only abnormal results are displayed) Labs Reviewed - No data to display  EKG  EKG Interpretation None       Radiology No results found.  Procedures Procedures (including critical care time)  Medications Ordered in ED Medications  HYDROcodone-acetaminophen (NORCO/VICODIN) 5-325 MG per tablet 1 tablet (1 tablet Oral Given 10/28/16 1446)   X-rays viewed by me Results for orders placed or performed during the hospital encounter of 04/05/16  CBC  Result Value Ref Range   WBC 8.0 4.0 - 10.5 K/uL   RBC 4.12 3.87 - 5.11 MIL/uL   Hemoglobin 10.6 (L) 12.0 - 15.0 g/dL   HCT 16.132.4 (L) 09.636.0  - 46.0 %   MCV 78.6 78.0 - 100.0 fL   MCH 25.7 (L) 26.0 - 34.0 pg   MCHC 32.7 30.0 - 36.0 g/dL   RDW 04.515.7 (H) 40.911.5 - 81.115.5 %   Platelets 204 150 - 400 K/uL  RPR  Result Value Ref Range   RPR Ser Ql Non Reactive Non Reactive  CBC  Result Value Ref Range   WBC 13.3 (H) 4.0 - 10.5 K/uL   RBC 3.78 (L) 3.87 - 5.11 MIL/uL   Hemoglobin 9.6 (L) 12.0 - 15.0 g/dL  HCT 29.9 (L) 36.0 - 46.0 %   MCV 79.1 78.0 - 100.0 fL   MCH 25.4 (L) 26.0 - 34.0 pg   MCHC 32.1 30.0 - 36.0 g/dL   RDW 16.1 (H) 09.6 - 04.5 %   Platelets 189 150 - 400 K/uL  Comprehensive metabolic panel  Result Value Ref Range   Sodium 137 135 - 145 mmol/L   Potassium 4.1 3.5 - 5.1 mmol/L   Chloride 108 101 - 111 mmol/L   CO2 22 22 - 32 mmol/L   Glucose, Bld 92 65 - 99 mg/dL   BUN 9 6 - 20 mg/dL   Creatinine, Ser 4.09 0.44 - 1.00 mg/dL   Calcium 8.5 (L) 8.9 - 10.3 mg/dL   Total Protein 5.1 (L) 6.5 - 8.1 g/dL   Albumin 2.4 (L) 3.5 - 5.0 g/dL   AST 34 15 - 41 U/L   ALT 19 14 - 54 U/L   Alkaline Phosphatase 125 38 - 126 U/L   Total Bilirubin 0.4 0.3 - 1.2 mg/dL   GFR calc non Af Amer >60 >60 mL/min   GFR calc Af Amer >60 >60 mL/min   Anion gap 7 5 - 15  Lactate dehydrogenase  Result Value Ref Range   LDH 255 (H) 98 - 192 U/L  Uric acid  Result Value Ref Range   Uric Acid, Serum 5.5 2.3 - 6.6 mg/dL  Type and screen  Result Value Ref Range   ABO/RH(D) A POS    Antibody Screen NEG    Sample Expiration 04/08/2016    Dg Lumbar Spine 2-3 Views  Result Date: 10/28/2016 CLINICAL DATA:  Initial evaluation for acute back pain status post trip and fall. EXAM: LUMBAR SPINE - 2-3 VIEW COMPARISON:  None. FINDINGS: There is no evidence of lumbar spine fracture. Alignment is normal. Intervertebral disc spaces are maintained. IMPRESSION: No radiographic evidence for acute abnormality within the lumbar spine. Electronically Signed   By: Rise Mu M.D.   On: 10/28/2016 15:52   Dg Ankle Complete Right  Result Date:  10/28/2016 CLINICAL DATA:  Moving and tripped over a box.  Pain. EXAM: RIGHT ANKLE - COMPLETE 3+ VIEW COMPARISON:  None. FINDINGS: There is no evidence of fracture, dislocation, or joint effusion. There is no evidence of arthropathy or other focal bone abnormality. Soft tissues are unremarkable. IMPRESSION: Negative. Electronically Signed   By: Kennith Center M.D.   On: 10/28/2016 16:05   Dg Hand Complete Left  Result Date: 10/28/2016 CLINICAL DATA:  Left hand pain after a fall today. Laceration at the distal fifth metatarsal level. Initial encounter. EXAM: LEFT HAND - COMPLETE 3+ VIEW COMPARISON:  None. FINDINGS: There is no evidence of fracture or dislocation. There is no evidence of arthropathy or other focal bone abnormality. Soft tissues are unremarkable. IMPRESSION: Negative. Electronically Signed   By: Sebastian Ache M.D.   On: 10/28/2016 15:51   Dg Foot Complete Right  Result Date: 10/28/2016 CLINICAL DATA:  Moving today and tripped over a box injuring right foot and ankle. EXAM: RIGHT FOOT COMPLETE - 3+ VIEW COMPARISON:  None. FINDINGS: There is no evidence of fracture or dislocation. There is no evidence of arthropathy or other focal bone abnormality. Soft tissues are unremarkable. IMPRESSION: Negative. Electronically Signed   By: Kennith Center M.D.   On: 10/28/2016 16:05    Initial Impression / Assessment and Plan / ED Course  I have reviewed the triage vital signs and the nursing notes.  Pertinent labs & imaging  results that were available during my care of the patient were reviewed by me and considered in my medical decision making (see chart for details).     ASO splint placed on left ankle by ED technician is. Comfortable for patient. Her hand wound was cleansed locally and dressed by ED tech. She was first crutches. She feels improved after treatment with Norco. Plan Tylenol or Advil for pain referral Dr.Hudnall if Neck and pain in a week.  Final Clinical Impressions(s) / ED  Diagnoses  Diagnosis #1 fall  #2 right ankle sprain  #3 right foot sprain  #4 Abrasion of left hand  #5 lumbar strain  Final diagnoses:  None    New Prescriptions New Prescriptions   No medications on file     Doug Sou, MD 10/28/16 1659    Doug Sou, MD 10/28/16 1700

## 2016-10-28 NOTE — Discharge Instructions (Signed)
Take take Tylenol or Advil as directed for pain. Placing an ice pack on the painful areas 4 times daily for 30 minutes at a time will help with pain as well . Wear the splint on your ankle as needed. It is okay to remove it to bathe or shower. Wash the wound on your left hand every day with soap and water and then place a thin layer of bacitracin ointment over the wound and cover with sterile bandage. Signs of infection include redness around the wound, more swelling, drainage from the wound or fever. Return or see your doctor if you think he might be developing an infection. Call Dr.Hudnall to schedule an office visit if having significant pain or difficulty walking by next week

## 2017-01-25 ENCOUNTER — Encounter (HOSPITAL_BASED_OUTPATIENT_CLINIC_OR_DEPARTMENT_OTHER): Payer: Self-pay

## 2017-01-25 ENCOUNTER — Emergency Department (HOSPITAL_BASED_OUTPATIENT_CLINIC_OR_DEPARTMENT_OTHER): Payer: Medicaid Other

## 2017-01-25 ENCOUNTER — Emergency Department (HOSPITAL_BASED_OUTPATIENT_CLINIC_OR_DEPARTMENT_OTHER)
Admission: EM | Admit: 2017-01-25 | Discharge: 2017-01-25 | Disposition: A | Discharge status: Home / Self Care | Payer: Medicaid Other | Attending: Emergency Medicine | Admitting: Emergency Medicine

## 2017-01-25 DIAGNOSIS — R51 Headache: Secondary | ICD-10-CM | POA: Insufficient documentation

## 2017-01-25 DIAGNOSIS — Z79899 Other long term (current) drug therapy: Secondary | ICD-10-CM | POA: Insufficient documentation

## 2017-01-25 DIAGNOSIS — R519 Headache, unspecified: Secondary | ICD-10-CM

## 2017-01-25 DIAGNOSIS — E860 Dehydration: Secondary | ICD-10-CM | POA: Insufficient documentation

## 2017-01-25 DIAGNOSIS — R42 Dizziness and giddiness: Secondary | ICD-10-CM

## 2017-01-25 DIAGNOSIS — J45909 Unspecified asthma, uncomplicated: Secondary | ICD-10-CM | POA: Insufficient documentation

## 2017-01-25 DIAGNOSIS — N3000 Acute cystitis without hematuria: Secondary | ICD-10-CM

## 2017-01-25 LAB — CBC WITH DIFFERENTIAL/PLATELET
BASOS ABS: 0 10*3/uL (ref 0.0–0.1)
BASOS PCT: 1 %
EOS PCT: 1 %
Eosinophils Absolute: 0.1 10*3/uL (ref 0.0–0.7)
HCT: 37.2 % (ref 36.0–46.0)
Hemoglobin: 12.7 g/dL (ref 12.0–15.0)
Lymphocytes Relative: 28 %
Lymphs Abs: 2 10*3/uL (ref 0.7–4.0)
MCH: 28.7 pg (ref 26.0–34.0)
MCHC: 34.1 g/dL (ref 30.0–36.0)
MCV: 84.2 fL (ref 78.0–100.0)
MONO ABS: 0.4 10*3/uL (ref 0.1–1.0)
Monocytes Relative: 5 %
Neutro Abs: 4.7 10*3/uL (ref 1.7–7.7)
Neutrophils Relative %: 65 %
PLATELETS: 279 10*3/uL (ref 150–400)
RBC: 4.42 MIL/uL (ref 3.87–5.11)
RDW: 14.3 % (ref 11.5–15.5)
WBC: 7.1 10*3/uL (ref 4.0–10.5)

## 2017-01-25 LAB — COMPREHENSIVE METABOLIC PANEL
ALBUMIN: 4.2 g/dL (ref 3.5–5.0)
ALT: 19 U/L (ref 14–54)
ANION GAP: 9 (ref 5–15)
AST: 20 U/L (ref 15–41)
Alkaline Phosphatase: 66 U/L (ref 38–126)
BUN: 9 mg/dL (ref 6–20)
CHLORIDE: 104 mmol/L (ref 101–111)
CO2: 25 mmol/L (ref 22–32)
Calcium: 9.2 mg/dL (ref 8.9–10.3)
Creatinine, Ser: 0.72 mg/dL (ref 0.44–1.00)
GFR calc Af Amer: >60 mL/min (ref >60)
GFR calc non Af Amer: >60 mL/min (ref >60)
Glucose, Bld: 94 mg/dL (ref 65–99)
POTASSIUM: 3.5 mmol/L (ref 3.5–5.1)
Sodium: 138 mmol/L (ref 135–145)
Total Bilirubin: 0.5 mg/dL (ref 0.3–1.2)
Total Protein: 7.7 g/dL (ref 6.5–8.1)

## 2017-01-25 LAB — URINALYSIS, ROUTINE W REFLEX MICROSCOPIC
Bilirubin Urine: NEGATIVE
Glucose, UA: NEGATIVE mg/dL
Hgb urine dipstick: NEGATIVE
KETONES UR: NEGATIVE mg/dL
NITRITE: NEGATIVE
PROTEIN: NEGATIVE mg/dL
Specific Gravity, Urine: 1.02 (ref 1.005–1.030)
pH: 6 (ref 5.0–8.0)

## 2017-01-25 LAB — URINALYSIS, MICROSCOPIC (REFLEX)

## 2017-01-25 LAB — LIPASE, BLOOD: LIPASE: 20 U/L (ref 11–51)

## 2017-01-25 MED ORDER — DIPHENHYDRAMINE HCL 50 MG/ML IJ SOLN
25.0000 mg | Freq: Once | INTRAMUSCULAR | Status: AC
  Administered 2017-01-25: 25 mg via INTRAVENOUS
  Filled 2017-01-25: qty 1

## 2017-01-25 MED ORDER — CEPHALEXIN 500 MG PO CAPS: 500.0000 mg | ORAL_CAPSULE | Freq: Two times a day (BID) | ORAL | 0 refills | Status: AC

## 2017-01-25 MED ORDER — PROCHLORPERAZINE MALEATE 10 MG PO TABS: 10.0000 mg | ORAL_TABLET | Freq: Two times a day (BID) | ORAL | 0 refills | Status: AC | PRN

## 2017-01-25 MED ORDER — SODIUM CHLORIDE 0.9 % IV BOLUS (SEPSIS)
1000.0000 mL | Freq: Once | INTRAVENOUS | Status: AC
  Administered 2017-01-25: 1000 mL via INTRAVENOUS

## 2017-01-25 MED ORDER — PROCHLORPERAZINE EDISYLATE 5 MG/ML IJ SOLN
10.0000 mg | Freq: Once | INTRAMUSCULAR | Status: AC
  Administered 2017-01-25: 10 mg via INTRAVENOUS
  Filled 2017-01-25: qty 2

## 2017-01-25 NOTE — Discharge Instructions (Signed)
Your workup today showed evidence of urinary tract infection. Please take the antibiotics as prescribed. Please use the nausea and headache medicine to help with your symptoms. Please call the number to follow-up with a primary care physician for further management. If any symptoms change or worsen, please return to the nearest emergency department.

## 2017-01-25 NOTE — ED Provider Notes (Signed)
[facility]   CSN: [id number] Arrival date & time: 01/25/17  1127     History   Chief Complaint Chief Complaint  Patient presents with  . Dizziness    HPI Carmen Harper is a 21 y.o. female.  The history is provided by the patient, a parent and medical records.  Migraine  This is a new problem. The current episode started more than 2 days ago. The problem occurs constantly. The problem has not changed since onset.Associated symptoms include headaches. Pertinent negatives include no chest pain, no abdominal pain and no shortness of breath. Nothing aggravates the symptoms. Nothing relieves the symptoms. She has tried nothing for the symptoms. The treatment provided no relief.    Past Medical History:  Diagnosis Date  . Asthma     Patient Active Problem List   Diagnosis Date Noted  . Anxiety 04/07/2016  . Vaginal delivery 04/06/2016  . Second-degree perineal laceration, with delivery 04/06/2016  . Premature beats 04/05/2016  . Positive GBS test 04/04/2016  . Asthma 04/04/2016    Past Surgical History:  Procedure Laterality Date  . breast tumor       OB History    Gravida Para Term Preterm AB Living   1 1 1     1    SAB TAB Ectopic Multiple Live Births         0 1       Home Medications    Prior to Admission medications   Medication Sig Start Date End Date Taking? Authorizing Provider  albuterol (VENTOLIN HFA) 108 (90 Base) MCG/ACT inhaler Inhale 1-2 puffs into the lungs every 6 (six) hours as needed for wheezing or shortness of breath.     [provider]  beclomethasone (QVAR) 80 MCG/ACT inhaler Inhale 2 puffs into the lungs 2 (two) times daily.     [provider]    Family History Family History  Problem Relation Age of Onset  . Alcohol abuse Neg Hx   . Arthritis Neg Hx   . Asthma Neg Hx   . Birth defects Neg Hx   . Cancer Neg Hx   . COPD Neg Hx   . Depression Neg Hx   . Diabetes Neg Hx   . Drug  abuse Neg Hx   . Early death Neg Hx   . Hearing loss Neg Hx   . Heart disease Neg Hx   . Hyperlipidemia Neg Hx   . Hypertension Neg Hx   . Kidney disease Neg Hx   . Learning disabilities Neg Hx   . Mental illness Neg Hx   . Mental retardation Neg Hx   . Miscarriages / Stillbirths Neg Hx   . Stroke Neg Hx   . Vision loss Neg Hx   . Varicose Veins Neg Hx     Social History Social History  Substance Use Topics  . Smoking status: Never Smoker  . Smokeless tobacco: Never Used  . Alcohol use No     Allergies   Sulfa antibiotics   Review of Systems Review of Systems  Constitutional: Positive for appetite change. Negative for chills, diaphoresis, fatigue and fever.  HENT: Negative for congestion and rhinorrhea.   Eyes: Positive for visual disturbance.  Respiratory: Negative for cough, chest tightness and shortness of breath.   Cardiovascular: Negative for chest pain, palpitations and leg swelling.  Gastrointestinal: Positive for nausea. Negative for abdominal pain, constipation, diarrhea and vomiting.  Genitourinary: Positive for frequency. Negative for decreased urine volume,  difficulty urinating, dysuria, vaginal bleeding and vaginal discharge.  Musculoskeletal: Negative for back pain, neck pain and neck stiffness.  Skin: Negative for rash.  Neurological: Positive for dizziness, light-headedness, numbness and headaches. Negative for seizures, facial asymmetry and weakness.  Psychiatric/Behavioral: Negative for agitation.  All other systems reviewed and are negative.    Physical Exam Updated Vital Signs BP 131/88 (BP Location: Right Arm)   Pulse 100   Temp 98.8 F (37.1 C) (Oral)   Resp 18   Ht 5\' 7"  (1.702 m)   Wt 102 kg (224 lb 13.9 oz)   LMP 01/03/2017   SpO2 100%   BMI 35.22 kg/m   Physical Exam  Constitutional: She is oriented to person, place, and time. She appears well-developed and well-nourished. No distress.  HENT:  Head: Normocephalic and atraumatic.   Right Ear: External ear normal.  Left Ear: External ear normal.  Nose: Nose normal.  Mouth/Throat: Oropharynx is clear and moist. No oropharyngeal exudate.  Eyes: Conjunctivae and EOM are normal. Pupils are equal, round, and reactive to light.  Neck: Normal range of motion. Neck supple.  Cardiovascular: Regular rhythm and intact distal pulses.   No murmur heard. Pulmonary/Chest: No stridor. No respiratory distress. She has no wheezes. She has no rales. She exhibits no tenderness.  Abdominal: Soft. She exhibits no distension. There is no tenderness. There is no rebound.  Musculoskeletal: She exhibits no tenderness.  Neurological: She is alert and oriented to person, place, and time. She has normal reflexes. She is not disoriented. She displays no tremor and normal reflexes. No cranial nerve deficit or sensory deficit. She exhibits normal muscle tone. She displays no seizure activity. Coordination and gait normal. GCS eye subscore is 4. GCS verbal subscore is 5. GCS motor subscore is 6.  Skin: Skin is warm. Capillary refill takes less than 2 seconds. No rash noted. She is not diaphoretic. No erythema.  Psychiatric: She has a normal mood and affect.  Nursing note and vitals reviewed.    ED Treatments / Results  Labs (all labs ordered are listed, but only abnormal results are displayed) Labs Reviewed  URINALYSIS, ROUTINE W REFLEX MICROSCOPIC - Abnormal; Notable for the following:       Result Value   APPearance CLOUDY (*)    Leukocytes, UA MODERATE (*)    All other components within normal limits  URINALYSIS, MICROSCOPIC (REFLEX) - Abnormal; Notable for the following:    Bacteria, UA FEW (*)    Squamous Epithelial / LPF 6-30 (*)    All other components within normal limits  URINE CULTURE  CBC WITH DIFFERENTIAL/PLATELET  COMPREHENSIVE METABOLIC PANEL  LIPASE, BLOOD    EKG  EKG Interpretation None       Radiology Ct Head Wo Contrast  Result Date: 01/25/2017 CLINICAL DATA:   Headache EXAM: CT HEAD WITHOUT CONTRAST TECHNIQUE: Contiguous axial images were obtained from the base of the skull through the vertex without intravenous contrast. COMPARISON:  None. FINDINGS: Brain: No mass lesion, intraparenchymal hemorrhage or extra-axial collection. No evidence of acute cortical infarct. Brain parenchyma and CSF-containing spaces are normal for age. Vascular: No hyperdense vessel or unexpected calcification. Skull: Normal visualized skull base, calvarium and extracranial soft tissues. Sinuses/Orbits: No sinus fluid levels or advanced mucosal thickening. No mastoid effusion. Normal orbits. IMPRESSION: Normal head CT. Electronically Signed   By: Deatra Robinson M.D.   On: 01/25/2017 15:20    Procedures Procedures (including critical care time)  Medications Ordered in ED Medications  sodium chloride 0.9 %  bolus 1,000 mL (0 mLs Intravenous Stopped 01/25/17 1630)  prochlorperazine (COMPAZINE) injection 10 mg (10 mg Intravenous Given 01/25/17 1521)  diphenhydrAMINE (BENADRYL) injection 25 mg (25 mg Intravenous Given 01/25/17 1522)     Initial Impression / Assessment and Plan / ED Course  I have reviewed the triage vital signs and the nursing notes.  Pertinent labs & imaging results that were available during my care of the patient were reviewed by me and considered in my medical decision making (see chart for details).     Bettyann Paulino RilyGrace Crampton is a 21 y.o. female with a past medical history significant for asthma, headaches, and prior benign breast tumor status post resection who presents with urinary frequency, migraine headaches, nausea, intermittent confusion, full-body tingling, unsteadiness, and blurry vision. Patient says that she has a strong family history of migraine headaches and has a history of migraine the past. She says that four days ago, she began having a headache which led to her having several neurologic symptoms. She feels as if she is tingly all over and has been  having a difficult time with her balance. She reports she may have had difficulty speaking but that has resolved. She reports some blurry vision that is associated with her headache. She says the headache is in the top of her head and is a seven out of 10 in severity on arrival. It is "throbbing and viselike". She denies diplopia or vomiting. She denies any weakness of extremities. She denies facial droop. She denies had trauma. She denies fevers or chills. She has not taken any medicine to help her symptoms.  History and exam are seen above. On exam, patient had no difficulty with coordination. Patient had normal strength in all extremities and no facial. Normal sensation  in all extremities. Normal reflexes. No nuchal rigidity or neck pain. Normal extraocular movements and pupillary reaction. Patient had no discernible gait abnormality on exam. No neck stiffness. Lungs clear. Chest nontender. Abdomen nontender.  Based on her description of multiple full-body neurologic complaints and her severe headache, suspect a complicated migraine. Patient will be given a headache cocktail during her workup. Given lack of neck pain, nuchal rigidity, fevers, do not feel patient has meningitis. Patient was informed that the only way to rule this out is with lumbar puncture, however, this is not recommended at this time. Based on gradual progression of pain and intermittent symptoms, do not feel this fits with a subarachnoid hemorrhage.   CT of the head was ordered given her history of breast tumor and the intermittent neurologic complaints.  While awaiting laboratory testing to look for occult infection and electrolyte abnormalities given the decreased oral intake and nausea, patient had significant improvement in symptoms after medications. Patient reports resolution of all neurologic deficits such as coordination problems, blurred vision, dizziness, and body tingling. Patients headache is nearly resolved.  Patient's  workup shows no evidence of abnormality on CT head. Given the frequency, urinalysis does show concern for UTI. Other laboratory testing reassuring.  Patient  will be given prescription for UTI and will also be instructed to follow up with her PCP for further management of UTI and headaches. Return precautions discussed for new or worsened symptoms. Shared decision-making conversation was held and decision made not to transfer for MRI or further workup given improving symptoms. Doubt TIA or stroke, highly suspect complicated migraine given improvement in symptoms with resolution of headache.  Patient and family understood plan of care and return precautions. Patient had no other  questions or concerns and patient was discharged in good condition.   Final Clinical Impressions(s) / ED Diagnoses   Final diagnoses:  Dehydration  Lightheadedness  Acute nonintractable headache, unspecified headache type  Acute cystitis without hematuria  Dizziness    New Prescriptions Discharge Medication List as of 01/25/2017  4:50 PM    START taking these medications   Details  cephALEXin (KEFLEX) 500 MG capsule Take 1 capsule (500 mg total) by mouth 2 (two) times daily., Starting Mon 01/25/2017, Until Mon 02/01/2017, Print    prochlorperazine (COMPAZINE) 10 MG tablet Take 1 tablet (10 mg total) by mouth 2 (two) times daily as needed for nausea or vomiting., Starting Mon 01/25/2017, Print       Clinical Impression: 1. Dehydration   2. Lightheadedness   3. Acute nonintractable headache, unspecified headache type   4. Acute cystitis without hematuria   5. Dizziness     Disposition: Discharge  Condition: Good  I have discussed the results, Dx and Tx plan with the pt(& family if present). He/she/they expressed understanding and agree(s) with the plan. Discharge instructions discussed at great length. Strict return precautions discussed and pt &/or family have verbalized understanding of the instructions. No  further questions at time of discharge.    Discharge Medication List as of 01/25/2017  4:50 PM    START taking these medications   Details  cephALEXin (KEFLEX) 500 MG capsule Take 1 capsule (500 mg total) by mouth 2 (two) times daily., Starting Mon 01/25/2017, Until Mon 02/01/2017, Print    prochlorperazine (COMPAZINE) 10 MG tablet Take 1 tablet (10 mg total) by mouth 2 (two) times daily as needed for nausea or vomiting., Starting Mon 01/25/2017, Print        Follow Up: [facility] [address] [place] [address] [phone] Schedule an appointment as soon as possible for a visit    [facility] [place] [facility]PARTMENT [address] [id number] mc [address] [place] [address] [phone]  If symptoms worsen     Priseis Cratty, Canary Brim, MD 01/25/17 2022

## 2017-01-25 NOTE — ED Triage Notes (Signed)
C/ dizziness, HA x 4 days-NAD-presents to triage in w/c

## 2017-01-26 LAB — URINE CULTURE: Culture: <10000 — AB

## 2017-03-15 ENCOUNTER — Encounter (HOSPITAL_BASED_OUTPATIENT_CLINIC_OR_DEPARTMENT_OTHER): Payer: Self-pay | Admitting: *Deleted

## 2017-03-15 ENCOUNTER — Emergency Department (HOSPITAL_BASED_OUTPATIENT_CLINIC_OR_DEPARTMENT_OTHER)
Admission: EM | Admit: 2017-03-15 | Discharge: 2017-03-15 | Disposition: A | Discharge status: Home / Self Care | Payer: Self-pay | Attending: Emergency Medicine | Admitting: Emergency Medicine

## 2017-03-15 DIAGNOSIS — J029 Acute pharyngitis, unspecified: Secondary | ICD-10-CM

## 2017-03-15 DIAGNOSIS — Z79899 Other long term (current) drug therapy: Secondary | ICD-10-CM | POA: Insufficient documentation

## 2017-03-15 DIAGNOSIS — J00 Acute nasopharyngitis [common cold]: Secondary | ICD-10-CM

## 2017-03-15 DIAGNOSIS — J45909 Unspecified asthma, uncomplicated: Secondary | ICD-10-CM | POA: Insufficient documentation

## 2017-03-15 LAB — RAPID STREP SCREEN (MED CTR MEBANE ONLY): STREPTOCOCCUS, GROUP A SCREEN (DIRECT): NEGATIVE

## 2017-03-15 MED ORDER — IBUPROFEN 400 MG PO TABS
600.0000 mg | ORAL_TABLET | Freq: Once | ORAL | Status: AC
  Administered 2017-03-15: 600 mg via ORAL
  Filled 2017-03-15: qty 1

## 2017-03-15 MED ORDER — FLUTICASONE PROPIONATE 50 MCG/ACT NA SUSP: 2.0000 | Freq: Every day | NASAL | 0 refills | Status: AC

## 2017-03-15 MED ORDER — CETIRIZINE HCL 10 MG PO TABS: 10.0000 mg | ORAL_TABLET | Freq: Every day | ORAL | 1 refills | Status: AC

## 2017-03-15 MED ORDER — NAPROXEN 500 MG PO TABS: 500.0000 mg | ORAL_TABLET | Freq: Two times a day (BID) | ORAL | 0 refills | Status: DC

## 2017-03-15 NOTE — ED Provider Notes (Signed)
[facility]   CSN: [id number] Arrival date & time: 03/15/17  1842     History   Chief Complaint Chief Complaint  Patient presents with  . Sore Throat    HPI Carmen Harper is a 21 y.o. female.  Carmen Harper is a 21 y.o. Female who presents to the emergency department complaining of a sore throat and postnasal drip since yesterday. Patient reports she had a fever 102 yesterday she thinks. She's been taking Tylenol today. She reports sharp pains to her throat and that her throat is bothering her the most. She has some runny nose and postnasal drip associated. She's had no trouble swallowing. No coughing. She denies coughing, wheezing, shortness of breath, vomiting, diarrhea, abdominal pain, trouble swallowing or rashes.   The history is provided by the patient and medical records. No language interpreter was used.  Sore Throat  Pertinent negatives include no chest pain, no abdominal pain, no headaches and no shortness of breath.    Past Medical History:  Diagnosis Date  . Asthma     Patient Active Problem List   Diagnosis Date Noted  . Anxiety 04/07/2016  . Vaginal delivery 04/06/2016  . Second-degree perineal laceration, with delivery 04/06/2016  . Premature beats 04/05/2016  . Positive GBS test 04/04/2016  . Asthma 04/04/2016    Past Surgical History:  Procedure Laterality Date  . breast tumor       OB History    Gravida Para Term Preterm AB Living   1 1 1     1    SAB TAB Ectopic Multiple Live Births         0 1       Home Medications    Prior to Admission medications   Medication Sig Start Date End Date Taking? Authorizing Provider  albuterol (VENTOLIN HFA) 108 (90 Base) MCG/ACT inhaler Inhale 1-2 puffs into the lungs every 6 (six) hours as needed for wheezing or shortness of breath.    Yes [provider]  beclomethasone (QVAR) 80 MCG/ACT inhaler Inhale 2 puffs into the lungs 2 (two) times daily.      [provider]  cetirizine (ZYRTEC ALLERGY) 10 MG tablet Take 1 tablet (10 mg total) by mouth daily. 03/15/17   Everlene Farrier, PA-C  fluticasone (FLONASE) 50 MCG/ACT nasal spray Place 2 sprays into both nostrils daily. 03/15/17   Everlene Farrier, PA-C  naproxen (NAPROSYN) 500 MG tablet Take 1 tablet (500 mg total) by mouth 2 (two) times daily with a meal. 03/15/17   Everlene Farrier, PA-C  prochlorperazine (COMPAZINE) 10 MG tablet Take 1 tablet (10 mg total) by mouth 2 (two) times daily as needed for nausea or vomiting. 01/25/17   Tegeler, Canary Brim, MD    Family History Family History  Problem Relation Age of Onset  . Alcohol abuse Neg Hx   . Arthritis Neg Hx   . Asthma Neg Hx   . Birth defects Neg Hx   . Cancer Neg Hx   . COPD Neg Hx   . Depression Neg Hx   . Diabetes Neg Hx   . Drug abuse Neg Hx   . Early death Neg Hx   . Hearing loss Neg Hx   . Heart disease Neg Hx   . Hyperlipidemia Neg Hx   . Hypertension Neg Hx   . Kidney disease Neg Hx   . Learning disabilities Neg Hx   . Mental illness Neg Hx   . Mental retardation Neg  Hx   . Miscarriages / Stillbirths Neg Hx   . Stroke Neg Hx   . Vision loss Neg Hx   . Varicose Veins Neg Hx     Social History Social History  Substance Use Topics  . Smoking status: Never Smoker  . Smokeless tobacco: Never Used  . Alcohol use No     Allergies   Sulfa antibiotics   Review of Systems Review of Systems  Constitutional: Positive for fatigue and fever.  HENT: Positive for congestion, postnasal drip, rhinorrhea, sneezing and sore throat. Negative for ear pain, hearing loss, mouth sores, trouble swallowing and voice change.   Eyes: Negative for visual disturbance.  Respiratory: Negative for cough and shortness of breath.   Cardiovascular: Negative for chest pain.  Gastrointestinal: Negative for abdominal pain, diarrhea, nausea and vomiting.  Genitourinary: Negative for dysuria.  Musculoskeletal: Positive for  myalgias. Negative for back pain, neck pain and neck stiffness.  Skin: Negative for rash.  Neurological: Negative for light-headedness and headaches.     Physical Exam Updated Vital Signs BP 122/83 (BP Location: Right Arm)   Pulse (!) 118   Temp 99.3 F (37.4 C) (Oral)   Resp 18   Ht 5' 5.5" (1.664 m)   Wt 102.1 kg (225 lb)   LMP 03/13/2017   SpO2 100%   BMI 36.87 kg/m   Physical Exam  Constitutional: She is oriented to person, place, and time. She appears well-developed and well-nourished. No distress.  Non-toxic appearing.   HENT:  Head: Normocephalic and atraumatic.  Right Ear: External ear normal.  Left Ear: External ear normal.  Mouth/Throat: Oropharynx is clear and moist. No oropharyngeal exudate.  Mild bilateral tonsillar hypertrophy without exudates. Uvula is midline without edema. No peritonsillar abscess. No trismus. No drooling. Rhinorrhea and boggy nasal turbinates noted bilaterally. Bilateral tympanic membranes are pearly-gray without erythema or loss of landmarks.   Eyes: Pupils are equal, round, and reactive to light. Conjunctivae are normal. Right eye exhibits no discharge. Left eye exhibits no discharge.  Neck: Normal range of motion. Neck supple. No JVD present. No tracheal deviation present.  Cardiovascular: Normal rate, regular rhythm, normal heart sounds and intact distal pulses.   HR 100 on my exam.   Pulmonary/Chest: Effort normal and breath sounds normal. No stridor. No respiratory distress. She has no wheezes. She has no rales.  Lungs are clear to ascultation bilaterally. Symmetric chest expansion bilaterally. No increased work of breathing. No rales or rhonchi.    Abdominal: Soft. There is no tenderness.  Musculoskeletal: She exhibits no edema.  Lymphadenopathy:    She has no cervical adenopathy.  Neurological: She is alert and oriented to person, place, and time. Coordination normal.  Skin: Skin is warm and dry. Capillary refill takes less than 2  seconds. No rash noted. She is not diaphoretic. No erythema. No pallor.  Psychiatric: She has a normal mood and affect. Her behavior is normal.  Nursing note and vitals reviewed.    ED Treatments / Results  Labs (all labs ordered are listed, but only abnormal results are displayed) Labs Reviewed  RAPID STREP SCREEN (NOT AT [facility])  CULTURE, GROUP A STREP [facility])    EKG  EKG Interpretation None       Radiology No results found.  Procedures Procedures (including critical care time)  Medications Ordered in ED Medications  ibuprofen (ADVIL,MOTRIN) tablet 600 mg (not administered)     Initial Impression / Assessment and Plan / ED Course  I have reviewed the triage  vital signs and the nursing notes.  Pertinent labs & imaging results that were available during my care of the patient were reviewed by me and considered in my medical decision making (see chart for details).     This  is a 21 y.o. Female who presents to the emergency department complaining of a sore throat and postnasal drip since yesterday. Patient reports she had a fever 102 yesterday she thinks. She's been taking Tylenol today. She reports sharp pains to her throat and that her throat is bothering her the most. She has some runny nose and postnasal drip associated. She's had no trouble swallowing. No coughing. On exam the patient is afebrile nontoxic appearing. She does have mild bilateral tonsillar hypertrophy without exudates. Uvula is midline without edema. No peritonsillar abscess. No trismus. No drooling.  Rhinorrhea and boggy nasal turbinates noted bilaterally. Lungs clear to auscultation bilaterally. Rapid strep is negative. Culture sent. Suspect viral pharyngitis. Will treat with Flonase, Zyrtec and naproxen. I discussed that strep test was sent for culture. Return precautions discussed. I advised the patient to follow-up with their primary care provider this week. I advised the patient to return to the  emergency department with new or worsening symptoms or new concerns. The patient verbalized understanding and agreement with plan.     Final Clinical Impressions(s) / ED Diagnoses   Final diagnoses:  Sore throat  Acute nasopharyngitis    New Prescriptions New Prescriptions   CETIRIZINE (ZYRTEC ALLERGY) 10 MG TABLET    Take 1 tablet (10 mg total) by mouth daily.   FLUTICASONE (FLONASE) 50 MCG/ACT NASAL SPRAY    Place 2 sprays into both nostrils daily.   NAPROXEN (NAPROSYN) 500 MG TABLET    Take 1 tablet (500 mg total) by mouth 2 (two) times daily with a meal.     Everlene FarrierDansie, Jourdyn Hasler, PA-C 03/15/17 2203    Tegeler, Canary Brimhristopher J, MD 03/16/17 0110

## 2017-03-15 NOTE — ED Triage Notes (Signed)
Sore throat. She thinks she has strep.

## 2017-03-18 LAB — CULTURE, GROUP A STREP (THRC)

## 2017-10-24 ENCOUNTER — Other Ambulatory Visit: Payer: Self-pay

## 2017-10-24 ENCOUNTER — Encounter (HOSPITAL_BASED_OUTPATIENT_CLINIC_OR_DEPARTMENT_OTHER): Payer: Self-pay | Admitting: Emergency Medicine

## 2017-10-24 ENCOUNTER — Emergency Department (HOSPITAL_BASED_OUTPATIENT_CLINIC_OR_DEPARTMENT_OTHER)
Admission: EM | Admit: 2017-10-24 | Discharge: 2017-10-24 | Disposition: A | Discharge status: Home / Self Care | Payer: Self-pay | Attending: Emergency Medicine | Admitting: Emergency Medicine

## 2017-10-24 DIAGNOSIS — M545 Low back pain, unspecified: Secondary | ICD-10-CM

## 2017-10-24 DIAGNOSIS — J45909 Unspecified asthma, uncomplicated: Secondary | ICD-10-CM | POA: Insufficient documentation

## 2017-10-24 DIAGNOSIS — R21 Rash and other nonspecific skin eruption: Secondary | ICD-10-CM | POA: Insufficient documentation

## 2017-10-24 HISTORY — DX: Anxiety disorder, unspecified: F41.9

## 2017-10-24 MED ORDER — METHOCARBAMOL 500 MG PO TABS: 500.0000 mg | ORAL_TABLET | Freq: Three times a day (TID) | ORAL | 0 refills | Status: DC | PRN

## 2017-10-24 NOTE — ED Provider Notes (Signed)
Emergency Department Provider Note   I have reviewed the triage vital signs and the nursing notes.   HISTORY  Chief Complaint Back Pain   HPI Carmen Harper is a 22 y.o. female with past medical history of chronic back pain presents with continued back discomfort and new rash in the area.  She uses heating pads intermittently along with over-the-counter Tylenol and Motrin.  She has been to her primary care physician regarding her back pain but no therapy has helped thus far.  She reports imaging of her spine which she thinks was an MRI but cannot recall where it was done.  She has not followed up with a spine specialist at this point because of lack of insurance.  She denies any sudden worsening back pain, fevers, chills, weakness, numbness, bowel/bladder symptoms.  She noticed the rash on her back yesterday which caused concern and prompted her ED visit primarily.   Past Medical History:  Diagnosis Date  . Anxiety   . Asthma     Patient Active Problem List   Diagnosis Date Noted  . Anxiety 04/07/2016  . Vaginal delivery 04/06/2016  . Second-degree perineal laceration, with delivery 04/06/2016  . Premature beats 04/05/2016  . Positive GBS test 04/04/2016  . Asthma 04/04/2016    Past Surgical History:  Procedure Laterality Date  . breast tumor       Current Outpatient Rx  . Order #: [id number] Class: Historical Med  . Order #: [id number] Class: Historical Med  . Order #: [id number] Class: Print  . Order #: [id number] Class: Print  . Order #: [id number] Class: Print  . Order #: [id number] Class: Print  . Order #: [id number] Class: Print    Allergies Sulfa antibiotics  Family History  Problem Relation Age of Onset  . Alcohol abuse Neg Hx   . Arthritis Neg Hx   . Asthma Neg Hx   . Birth defects Neg Hx   . Cancer Neg Hx   . COPD Neg Hx   . Depression Neg Hx   . Diabetes Neg Hx   . Drug abuse Neg Hx   . Early death Neg Hx   . Hearing loss Neg Hx   . Heart disease  Neg Hx   . Hyperlipidemia Neg Hx   . Hypertension Neg Hx   . Kidney disease Neg Hx   . Learning disabilities Neg Hx   . Mental illness Neg Hx   . Mental retardation Neg Hx   . Miscarriages / Stillbirths Neg Hx   . Stroke Neg Hx   . Vision loss Neg Hx   . Varicose Veins Neg Hx     Social History Social History   Tobacco Use  . Smoking status: Never Smoker  . Smokeless tobacco: Never Used  Substance Use Topics  . Alcohol use: No  . Drug use: No    Review of Systems  Constitutional: No fever/chills Eyes: No visual changes. ENT: No sore throat. Cardiovascular: Denies chest pain. Respiratory: Denies shortness of breath. Gastrointestinal: No abdominal pain.  No nausea, no vomiting.  No diarrhea.  No constipation. Genitourinary: Negative for dysuria. Musculoskeletal: Positive for back pain. Skin: Positive for rash. Neurological: Negative for headaches, focal weakness or numbness.  10-point ROS otherwise negative.  ____________________________________________   PHYSICAL EXAM:  VITAL SIGNS: ED Triage Vitals  Enc Vitals Group     BP 10/24/17 1542 130/72     Pulse Rate 10/24/17 1542 91     Resp 10/24/17 1542 16     Temp 10/24/17 1542  98.8 F (37.1 C)     Temp Source 10/24/17 1542 Oral     SpO2 10/24/17 1542 100 %     Weight 10/24/17 1541 210 lb (95.3 kg)     Height 10/24/17 1541 5\' 7"  (1.702 m)     Pain Score 10/24/17 1541 7   Constitutional: Alert and oriented. Well appearing and in no acute distress. Eyes: Conjunctivae are normal.  Head: Atraumatic. Nose: No congestion/rhinnorhea. Mouth/Throat: Mucous membranes are moist.  Neck: No stridor.   Cardiovascular: Normal rate, regular rhythm. Good peripheral circulation. Grossly normal heart sounds.   Respiratory: Normal respiratory effort.  No retractions. Lungs CTAB. Gastrointestinal: No distention.  Musculoskeletal: No lower extremity tenderness nor edema. No gross deformities of extremities. No midline  tenderness to palpation of the thoracic or lumbar spine.  Neurologic:  Normal speech and language. No gross focal neurologic deficits are appreciated. Normal gait.  Skin:  Skin is warm, dry and intact. Lacy, maroon, rash over the lower back diffusely. No cellulitis, petechiae, or bullae.    ____________________________________________   PROCEDURES  Procedure(s) performed:   Procedures  None ____________________________________________   INITIAL IMPRESSION / ASSESSMENT AND PLAN / ED COURSE  Pertinent labs & imaging results that were available during my care of the patient were reviewed by me and considered in my medical decision making (see chart for details).  Patient with a dark, lacy rash over the lower back.  No petechiae.  No evidence of cellulitis or abscess.  No midline spine tenderness.  No signs or symptoms to suggest acute spinal cord emergency or require emergent MRI.  I believe the patient's rash is related to heating pad use.  I advised that she try and keep that area cool and dry when not using her heating pad.   At this time, I do not feel there is any life-threatening condition present. I have reviewed and discussed all results (EKG, imaging, lab, urine as appropriate), exam findings with patient. I have reviewed nursing notes and appropriate previous records.  I feel the patient is safe to be discharged home without further emergent workup. Discussed usual and customary return precautions. Patient and family (if present) verbalize understanding and are comfortable with this plan.  Patient will follow-up with their primary care provider. If they do not have a primary care provider, information for follow-up has been provided to them. All questions have been answered.  ____________________________________________  FINAL CLINICAL IMPRESSION(S) / ED DIAGNOSES  Final diagnoses:  Acute bilateral low back pain without sciatica  Rash    NEW OUTPATIENT MEDICATIONS STARTED  DURING THIS VISIT:  Discharge Medication List as of 10/24/2017  4:15 PM    START taking these medications   Details  methocarbamol (ROBAXIN) 500 MG tablet Take 1 tablet (500 mg total) by mouth every 8 (eight) hours as needed for muscle spasms., Starting Sun 10/24/2017, Print        Note:  This document was prepared using Dragon voice recognition software and may include unintentional dictation errors.  Alona Bene, MD Emergency Medicine    Laren Orama, Arlyss Repress, MD 10/25/17 [phone]

## 2017-10-24 NOTE — Discharge Instructions (Signed)

## 2017-10-24 NOTE — ED Triage Notes (Signed)
Lower back pain for several months. Pt noticed large area of discoloration to back last night.

## 2018-04-29 ENCOUNTER — Emergency Department (HOSPITAL_BASED_OUTPATIENT_CLINIC_OR_DEPARTMENT_OTHER): Payer: Medicaid Other

## 2018-04-29 ENCOUNTER — Encounter (HOSPITAL_BASED_OUTPATIENT_CLINIC_OR_DEPARTMENT_OTHER): Payer: Self-pay | Admitting: Emergency Medicine

## 2018-04-29 ENCOUNTER — Other Ambulatory Visit: Payer: Self-pay

## 2018-04-29 ENCOUNTER — Emergency Department (HOSPITAL_BASED_OUTPATIENT_CLINIC_OR_DEPARTMENT_OTHER)
Admission: EM | Admit: 2018-04-29 | Discharge: 2018-04-29 | Disposition: A | Discharge status: Home / Self Care | Payer: Medicaid Other | Attending: Emergency Medicine | Admitting: Emergency Medicine

## 2018-04-29 DIAGNOSIS — S81802A Unspecified open wound, left lower leg, initial encounter: Secondary | ICD-10-CM

## 2018-04-29 DIAGNOSIS — Y998 Other external cause status: Secondary | ICD-10-CM | POA: Diagnosis not present

## 2018-04-29 DIAGNOSIS — O9989 Other specified diseases and conditions complicating pregnancy, childbirth and the puerperium: Secondary | ICD-10-CM | POA: Insufficient documentation

## 2018-04-29 DIAGNOSIS — S8992XA Unspecified injury of left lower leg, initial encounter: Secondary | ICD-10-CM | POA: Insufficient documentation

## 2018-04-29 DIAGNOSIS — W01198A Fall on same level from slipping, tripping and stumbling with subsequent striking against other object, initial encounter: Secondary | ICD-10-CM | POA: Insufficient documentation

## 2018-04-29 DIAGNOSIS — Y9289 Other specified places as the place of occurrence of the external cause: Secondary | ICD-10-CM | POA: Diagnosis not present

## 2018-04-29 DIAGNOSIS — Y9302 Activity, running: Secondary | ICD-10-CM | POA: Insufficient documentation

## 2018-04-29 DIAGNOSIS — M79662 Pain in left lower leg: Secondary | ICD-10-CM

## 2018-04-29 DIAGNOSIS — J45909 Unspecified asthma, uncomplicated: Secondary | ICD-10-CM | POA: Diagnosis not present

## 2018-04-29 DIAGNOSIS — Z79899 Other long term (current) drug therapy: Secondary | ICD-10-CM | POA: Diagnosis not present

## 2018-04-29 DIAGNOSIS — Z3A24 24 weeks gestation of pregnancy: Secondary | ICD-10-CM | POA: Insufficient documentation

## 2018-04-29 MED ORDER — BACITRACIN ZINC 500 UNIT/GM EX OINT: 1.0000 "application " | TOPICAL_OINTMENT | Freq: Two times a day (BID) | CUTANEOUS | 0 refills | Status: AC

## 2018-04-29 MED ORDER — BACITRACIN ZINC 500 UNIT/GM EX OINT: 1.0000 "application " | TOPICAL_OINTMENT | Freq: Two times a day (BID) | CUTANEOUS | Status: DC

## 2018-04-29 MED ORDER — OXYCODONE-ACETAMINOPHEN 5-325 MG PO TABS
1.0000 | ORAL_TABLET | Freq: Once | ORAL | Status: AC
  Administered 2018-04-29: 1 via ORAL
  Filled 2018-04-29: qty 1

## 2018-04-29 MED ORDER — BACITRACIN ZINC 500 UNIT/GM EX OINT: 1.0000 "application " | TOPICAL_OINTMENT | Freq: Two times a day (BID) | CUTANEOUS | 0 refills | Status: DC

## 2018-04-29 NOTE — Discharge Instructions (Signed)
USE CRUTCHES AND KEEP LEG ELEVATED. DO NOT BEAR WEIGHT ON LEG UNTIL SEEN BY ORTHOPEDIC DOCTOR. KEEP WOUNDS CLEAN AND DRY, APPLY BACITRACIN/NEOSPORIN DAILY. RETURN TO ER IF ANY SIGNS OF INFECTION OF WOUNDS.

## 2018-04-29 NOTE — ED Triage Notes (Signed)
Fall 2 days ago.  Abrasion to left lower leg and ankle swelling.  Reports she is [redacted] weeks pregnant but no abdominal injuries.

## 2018-04-29 NOTE — ED Provider Notes (Signed)
[facility] EMERGENCY DEPARTMENT Provider Note   CSN: [id number] Arrival date & time: 04/29/18  1514     History   Chief Complaint Chief Complaint  Patient presents with  . Leg Injury    HPI Carmen Harper is a 22 y.o. female.  21 year old female with past medical history including asthma, anxiety, chronic low back pain currently [redacted] weeks pregnant who presents with left lower leg injury.  2 days ago the patient was chasing her daughter and tripped and fell onto concrete on her left lower leg.  She sustained multiple wounds to her leg.  She states she has not been able to walk because of severe, constant pain.  She has tried over-the-counter medications with no relief.  Pain is worse when she tries to put pressure on her foot.  She has applied topical medications with no relief.  Last dose of Tylenol was 1.5 hours ago.  The history is provided by the patient.    Past Medical History:  Diagnosis Date  . Anxiety   . Asthma     Patient Active Problem List   Diagnosis Date Noted  . Anxiety 04/07/2016  . Vaginal delivery 04/06/2016  . Second-degree perineal laceration, with delivery 04/06/2016  . Premature beats 04/05/2016  . Positive GBS test 04/04/2016  . Asthma 04/04/2016    Past Surgical History:  Procedure Laterality Date  . breast tumor        OB History    Gravida  2   Para  1   Term  1   Preterm      AB      Living  1     SAB      TAB      Ectopic      Multiple  0   Live Births  1            Home Medications    Prior to Admission medications   Medication Sig Start Date End Date Taking? Authorizing Provider  busPIRone HCl (BUSPAR PO) Take by mouth.   Yes [provider]  albuterol (VENTOLIN HFA) 108 (90 Base) MCG/ACT inhaler Inhale 1-2 puffs into the lungs every 6 (six) hours as needed for wheezing or shortness of breath.     [provider]  bacitracin ointment Apply 1 application topically 2 (two)  times daily. Apply to wounds on leg 04/29/18   Nyelah Emmerich, Ambrose Finland, MD  beclomethasone (QVAR) 80 MCG/ACT inhaler Inhale 2 puffs into the lungs 2 (two) times daily.     [provider]  cetirizine (ZYRTEC ALLERGY) 10 MG tablet Take 1 tablet (10 mg total) by mouth daily. 03/15/17   Everlene Farrier, PA-C  fluticasone (FLONASE) 50 MCG/ACT nasal spray Place 2 sprays into both nostrils daily. 03/15/17   Everlene Farrier, PA-C  methocarbamol (ROBAXIN) 500 MG tablet Take 1 tablet (500 mg total) by mouth every 8 (eight) hours as needed for muscle spasms. 10/24/17   Long, Arlyss Repress, MD  naproxen (NAPROSYN) 500 MG tablet Take 1 tablet (500 mg total) by mouth 2 (two) times daily with a meal. 03/15/17   Everlene Farrier, PA-C  prochlorperazine (COMPAZINE) 10 MG tablet Take 1 tablet (10 mg total) by mouth 2 (two) times daily as needed for nausea or vomiting. 01/25/17   Tegeler, Canary Brim, MD    Family History Family History  Problem Relation Age of Onset  . Alcohol abuse Neg Hx   . Arthritis Neg Hx   . Asthma Neg  Hx   . Birth defects Neg Hx   . Cancer Neg Hx   . COPD Neg Hx   . Depression Neg Hx   . Diabetes Neg Hx   . Drug abuse Neg Hx   . Early death Neg Hx   . Hearing loss Neg Hx   . Heart disease Neg Hx   . Hyperlipidemia Neg Hx   . Hypertension Neg Hx   . Kidney disease Neg Hx   . Learning disabilities Neg Hx   . Mental illness Neg Hx   . Mental retardation Neg Hx   . Miscarriages / Stillbirths Neg Hx   . Stroke Neg Hx   . Vision loss Neg Hx   . Varicose Veins Neg Hx     Social History Social History   Tobacco Use  . Smoking status: Never Smoker  . Smokeless tobacco: Never Used  Substance Use Topics  . Alcohol use: No  . Drug use: No     Allergies   Sulfa antibiotics   Review of Systems Review of Systems  Constitutional: Negative for fever.  Musculoskeletal: Positive for gait problem.  Skin: Positive for wound.  Neurological: Negative for numbness.      Physical Exam Updated Vital Signs BP 127/82 (BP Location: Right Arm)   Pulse (!) 111   Temp 98.1 F (36.7 C) (Oral)   Resp 16   Ht 5\' 5"  (1.651 m)   Wt 109.8 kg   LMP 11/01/2017   SpO2 100%   BMI 40.27 kg/m   Physical Exam  Constitutional: She is oriented to person, place, and time. She appears well-developed and well-nourished. No distress.  HENT:  Head: Normocephalic and atraumatic.  Eyes: Conjunctivae are normal.  Neck: Neck supple.  Cardiovascular: Intact distal pulses.  Musculoskeletal: She exhibits edema and tenderness.  Mild edema anterior L lower leg without calf tenderness; multiple abrasions on lower leg from just below knee down to just above ankle  Neurological: She is alert and oriented to person, place, and time. No sensory deficit.  Skin: Skin is warm and dry.  Multiple abrasions anterior L lower leg  Psychiatric: She has a normal mood and affect. Judgment normal.  Nursing note and vitals reviewed.    ED Treatments / Results  Labs (all labs ordered are listed, but only abnormal results are displayed) Labs Reviewed - No data to display  EKG None  Radiology Dg Tibia/fibula Left  Result Date: 04/29/2018 CLINICAL DATA:  Status post fall 2 days ago.  Left lower leg pain. EXAM: LEFT TIBIA AND FIBULA - 2 VIEW COMPARISON:  None. FINDINGS: There is no evidence of fracture or other focal bone lesions. Soft tissues are unremarkable. IMPRESSION: No acute osseous injury of the left tibia or fibula. Electronically Signed   By: Elige Ko   On: 04/29/2018 18:09   Dg Knee Complete 4 Views Left  Result Date: 04/29/2018 CLINICAL DATA:  Fall.  Pain. EXAM: LEFT KNEE - COMPLETE 4+ VIEW COMPARISON:  None. FINDINGS: No evidence of fracture or dislocation. Moderate-sized effusion. No foreign body. IMPRESSION: Positive for effusion.  No fracture or dislocation. Electronically Signed   By: Elsie Stain M.D.   On: 04/29/2018 17:02    Procedures Procedures (including  critical care time)  Medications Ordered in ED Medications  oxyCODONE-acetaminophen (PERCOCET/ROXICET) 5-325 MG per tablet 1 tablet (1 tablet Oral Given 04/29/18 1732)     Initial Impression / Assessment and Plan / ED Course  I have reviewed the triage vital signs  and the nursing notes.  Pertinent labs & imaging results that were available during my care of the patient were reviewed by me and considered in my medical decision making (see chart for details).     Multiple abrasions on exam, no signs of infection. I doubt DVT as pain began immediately after injury and seems to be focal to areas of wound. Pt requested pain medication multiple times but I explained that narcotics are not recommended during pregnancy. Recommended scheduled tylenol, discussed wound care including bacitracin and keeping dry.   XR knee shows moderate effusion. XR tib/fib negative. Pt is adamant that she has not been able to bear any weight on leg.  Suspicion is that most of her pain is related to the soft tissue injury but cannot exclude tibial plateau fracture given effusion.  I initially recommended CT after long discussion of radiation risk versus benefits and patient was in agreement but radiologist declined to do study based on radiation risk.  I offered to transfer the patient to [facility] for MRI to evaluate for occult fracture versus follow-up with orthopedics and strict nonweightbearing until that time.  She understands options and prefers orthopedics follow-up.  Have placed in a knee immobilizer.  Continue to ask for narcotic pain medicine and I referred her to her OB/GYN as I do not routinely prescribe narcotics for pregnant women.  I have reviewed return precautions regarding signs of infection of her wounds.  Final Clinical Impressions(s) / ED Diagnoses   Final diagnoses:  Pain of left lower leg  Wound of left lower extremity, initial encounter    ED Discharge Orders         Ordered    bacitracin  ointment  2 times daily,   Status:  Discontinued     04/29/18 2007    bacitracin ointment  2 times daily     04/29/18 2007           Ahlani Wickes, Ambrose Finland, MD 04/30/18 [phone]

## 2018-04-29 NOTE — ED Notes (Signed)
ED Provider at bedside. 

## 2018-04-29 NOTE — ED Notes (Signed)
Patient transported to X-ray 

## 2018-08-26 ENCOUNTER — Emergency Department (HOSPITAL_BASED_OUTPATIENT_CLINIC_OR_DEPARTMENT_OTHER)
Admission: EM | Admit: 2018-08-26 | Discharge: 2018-08-26 | Disposition: A | Discharge status: Home / Self Care | Payer: Medicaid Other | Attending: Emergency Medicine | Admitting: Emergency Medicine

## 2018-08-26 ENCOUNTER — Encounter (HOSPITAL_BASED_OUTPATIENT_CLINIC_OR_DEPARTMENT_OTHER): Payer: Self-pay | Admitting: Emergency Medicine

## 2018-08-26 ENCOUNTER — Other Ambulatory Visit: Payer: Self-pay

## 2018-08-26 DIAGNOSIS — M5442 Lumbago with sciatica, left side: Secondary | ICD-10-CM | POA: Insufficient documentation

## 2018-08-26 DIAGNOSIS — J45909 Unspecified asthma, uncomplicated: Secondary | ICD-10-CM | POA: Insufficient documentation

## 2018-08-26 DIAGNOSIS — O139 Gestational [pregnancy-induced] hypertension without significant proteinuria, unspecified trimester: Secondary | ICD-10-CM

## 2018-08-26 DIAGNOSIS — M5441 Lumbago with sciatica, right side: Secondary | ICD-10-CM | POA: Insufficient documentation

## 2018-08-26 DIAGNOSIS — M545 Low back pain: Secondary | ICD-10-CM | POA: Diagnosis present

## 2018-08-26 DIAGNOSIS — Z79899 Other long term (current) drug therapy: Secondary | ICD-10-CM | POA: Insufficient documentation

## 2018-08-26 MED ORDER — OXYCODONE-ACETAMINOPHEN 5-325 MG PO TABS: 1.0000 | ORAL_TABLET | ORAL | 0 refills | Status: DC | PRN

## 2018-08-26 MED ORDER — OXYCODONE-ACETAMINOPHEN 5-325 MG PO TABS
2.0000 | ORAL_TABLET | Freq: Once | ORAL | Status: AC
  Administered 2018-08-26: 2 via ORAL
  Filled 2018-08-26: qty 2

## 2018-08-26 NOTE — ED Triage Notes (Signed)
Appointment with anesthesia next week

## 2018-08-26 NOTE — Discharge Instructions (Signed)
Apply ice for thirty minutes at a time, four times a day (may do more if you want to).

## 2018-08-26 NOTE — ED Provider Notes (Signed)
[facility] EMERGENCY DEPARTMENT Provider Note   CSN: [id number] Arrival date & time: 08/26/18  2222     History   Chief Complaint Chief Complaint  Patient presents with  . Back Pain    HPI Fenix Gabreilla Bartron is a 23 y.o. female.  The history is provided by the patient.  She is 10 days post vaginal delivery and has been having low back pain related to her epidural that was administered.  In the hospital, she had been getting adequate pain relief with oxycodone-acetaminophen.  As an outpatient, she continues to have pain.  She saw her obstetrical provider earlier today and was given a paper prescription for oxycodone-acetaminophen, but it lacked the DEA number and the pharmacy would not fill the prescription and is holding the prescription.  She is requesting a supply of oxycodone-acetaminophen until she can be seen at a pain management clinic.  She feels generally weak but has no focal weakness.  There is no bowel or bladder dysfunction.  She denies any numbness or tingling.  She is also on medication for pregnancy-induced hypertension.  Past Medical History:  Diagnosis Date  . Anxiety   . Asthma     Patient Active Problem List   Diagnosis Date Noted  . Anxiety 04/07/2016  . Vaginal delivery 04/06/2016  . Second-degree perineal laceration, with delivery 04/06/2016  . Premature beats 04/05/2016  . Positive GBS test 04/04/2016  . Asthma 04/04/2016    Past Surgical History:  Procedure Laterality Date  . breast tumor        OB History    Gravida  2   Para  1   Term  1   Preterm      AB      Living  1     SAB      TAB      Ectopic      Multiple  0   Live Births  1            Home Medications    Prior to Admission medications   Medication Sig Start Date End Date Taking? Authorizing Provider  albuterol (VENTOLIN HFA) 108 (90 Base) MCG/ACT inhaler Inhale 1-2 puffs into the lungs every 6 (six) hours as needed for wheezing or shortness of  breath.     [provider]  bacitracin ointment Apply 1 application topically 2 (two) times daily. Apply to wounds on leg 04/29/18   Little, Ambrose Finland, MD  beclomethasone (QVAR) 80 MCG/ACT inhaler Inhale 2 puffs into the lungs 2 (two) times daily.     [provider]  busPIRone HCl (BUSPAR PO) Take by mouth.    [provider]  cetirizine (ZYRTEC ALLERGY) 10 MG tablet Take 1 tablet (10 mg total) by mouth daily. 03/15/17   Everlene Farrier, PA-C  fluticasone (FLONASE) 50 MCG/ACT nasal spray Place 2 sprays into both nostrils daily. 03/15/17   Everlene Farrier, PA-C  methocarbamol (ROBAXIN) 500 MG tablet Take 1 tablet (500 mg total) by mouth every 8 (eight) hours as needed for muscle spasms. 10/24/17   Long, Arlyss Repress, MD  naproxen (NAPROSYN) 500 MG tablet Take 1 tablet (500 mg total) by mouth 2 (two) times daily with a meal. 03/15/17   Everlene Farrier, PA-C  prochlorperazine (COMPAZINE) 10 MG tablet Take 1 tablet (10 mg total) by mouth 2 (two) times daily as needed for nausea or vomiting. 01/25/17   Tegeler, Canary Brim, MD    Family History Family History  Problem Relation  Age of Onset  . Alcohol abuse Neg Hx   . Arthritis Neg Hx   . Asthma Neg Hx   . Birth defects Neg Hx   . Cancer Neg Hx   . COPD Neg Hx   . Depression Neg Hx   . Diabetes Neg Hx   . Drug abuse Neg Hx   . Early death Neg Hx   . Hearing loss Neg Hx   . Heart disease Neg Hx   . Hyperlipidemia Neg Hx   . Hypertension Neg Hx   . Kidney disease Neg Hx   . Learning disabilities Neg Hx   . Mental illness Neg Hx   . Mental retardation Neg Hx   . Miscarriages / Stillbirths Neg Hx   . Stroke Neg Hx   . Vision loss Neg Hx   . Varicose Veins Neg Hx     Social History Social History   Tobacco Use  . Smoking status: Never Smoker  . Smokeless tobacco: Never Used  Substance Use Topics  . Alcohol use: No  . Drug use: No     Allergies   Sulfa antibiotics   Review of Systems Review of  Systems  All other systems reviewed and are negative.    Physical Exam Updated Vital Signs BP (!) 141/84 (BP Location: Left Arm)   Pulse (!) 127   Temp 98.8 F (37.1 C) (Oral)   Resp 20   Ht 5\' 5"  (1.651 m)   Wt 122.5 kg   LMP 11/01/2017   SpO2 100%   BMI 44.93 kg/m   Physical Exam Vitals signs and nursing note reviewed.    23 year old female, resting comfortably and in no acute distress. Vital signs are significant for elevated blood pressure and heart rate. Oxygen saturation is 100%, which is normal. Head is normocephalic and atraumatic. PERRLA, EOMI. Oropharynx is clear. Neck is nontender and supple without adenopathy or JVD. Back is very tender throughout the lumbar area.  Positive straight leg raise bilaterally at 15 degrees.  There is no CVA tenderness. Lungs are clear without rales, wheezes, or rhonchi. Chest is nontender. Heart has regular rate and rhythm without murmur. Abdomen is soft, flat, nontender without masses or hepatosplenomegaly and peristalsis is normoactive. Extremities have no cyanosis or edema, full range of motion is present. Skin is warm and dry without rash. Neurologic: Mental status is normal, cranial nerves are intact, there are no motor or sensory deficits.  ED Treatments / Results   Procedures Procedures   Medications Ordered in ED Medications - No data to display   Initial Impression / Assessment and Plan / ED Course  I have reviewed the triage vital signs and the nursing notes.  Low back pain related to epidural.  Old records are reviewed confirming patient has been seen through her OB/GYN clinic and had received a prescription for oxycodone-acetaminophen 10-325 20 tablets that was written and not electronically prescribed.  I have reviewed [place] record on the [place] controlled substance reporting website, and the only prescription on record was from last April.  It seems reasonable to give her the prescription that was intended to  be given at the physician's office earlier today.  She is given prescription for oxycodone-acetaminophen 10-300 2520tablets and is referred back to her obstetrician.  Of note, the note does state that they were not going to give her any further narcotic prescriptions.  I have explained to patient that state law forbids me from giving her more than a  5-day supply.  Final Clinical Impressions(s) / ED Diagnoses   Final diagnoses:  Acute midline low back pain with bilateral sciatica  Pregnancy induced hypertension, antepartum    ED Discharge Orders         Ordered    oxyCODONE-acetaminophen (PERCOCET) 5-325 MG tablet  Every 4 hours PRN     08/26/18 2322           Dione Booze, MD 08/26/18 2328

## 2018-08-26 NOTE — ED Triage Notes (Signed)
Pt states she gave birth on Tuesday 14th. States they attempted to give an epidural and was unsuccessful. [facility] [place] and was given a hand written prescription for oxycodone 10s but pharmacy was unable to fill medications because of no DEA number and the fact it was hand written. Pt states large bruising and pain from attempted epidural and stated they wanted her on pain medication.

## 2018-08-26 NOTE — ED Notes (Signed)
PT states understanding of care given, follow up care, and medication prescribed. PT ambulated from ED to car with a steady gait. 

## 2018-10-10 ENCOUNTER — Encounter (HOSPITAL_BASED_OUTPATIENT_CLINIC_OR_DEPARTMENT_OTHER): Payer: Self-pay | Admitting: *Deleted

## 2018-10-10 ENCOUNTER — Other Ambulatory Visit: Payer: Self-pay

## 2018-10-10 ENCOUNTER — Emergency Department (HOSPITAL_BASED_OUTPATIENT_CLINIC_OR_DEPARTMENT_OTHER)
Admission: EM | Admit: 2018-10-10 | Discharge: 2018-10-10 | Disposition: A | Discharge status: Home / Self Care | Payer: Medicaid Other | Attending: Emergency Medicine | Admitting: Emergency Medicine

## 2018-10-10 ENCOUNTER — Emergency Department (HOSPITAL_BASED_OUTPATIENT_CLINIC_OR_DEPARTMENT_OTHER): Payer: Medicaid Other

## 2018-10-10 DIAGNOSIS — Y9301 Activity, walking, marching and hiking: Secondary | ICD-10-CM | POA: Insufficient documentation

## 2018-10-10 DIAGNOSIS — S93402A Sprain of unspecified ligament of left ankle, initial encounter: Secondary | ICD-10-CM | POA: Diagnosis not present

## 2018-10-10 DIAGNOSIS — Z79899 Other long term (current) drug therapy: Secondary | ICD-10-CM | POA: Insufficient documentation

## 2018-10-10 DIAGNOSIS — Y929 Unspecified place or not applicable: Secondary | ICD-10-CM | POA: Insufficient documentation

## 2018-10-10 DIAGNOSIS — Y999 Unspecified external cause status: Secondary | ICD-10-CM | POA: Diagnosis not present

## 2018-10-10 DIAGNOSIS — J45909 Unspecified asthma, uncomplicated: Secondary | ICD-10-CM | POA: Diagnosis not present

## 2018-10-10 DIAGNOSIS — X500XXA Overexertion from strenuous movement or load, initial encounter: Secondary | ICD-10-CM | POA: Diagnosis not present

## 2018-10-10 DIAGNOSIS — S99912A Unspecified injury of left ankle, initial encounter: Secondary | ICD-10-CM | POA: Diagnosis present

## 2018-10-10 MED ORDER — HYDROCODONE-ACETAMINOPHEN 5-325 MG PO TABS
1.0000 | ORAL_TABLET | Freq: Once | ORAL | Status: AC
  Administered 2018-10-10: 1 via ORAL
  Filled 2018-10-10: qty 1

## 2018-10-10 MED ORDER — IBUPROFEN 800 MG PO TABS: 800.0000 mg | ORAL_TABLET | Freq: Three times a day (TID) | ORAL | 0 refills | Status: DC | PRN

## 2018-10-10 MED ORDER — HYDROCODONE-ACETAMINOPHEN 5-325 MG PO TABS: 1.0000 | ORAL_TABLET | Freq: Four times a day (QID) | ORAL | 0 refills | Status: DC | PRN

## 2018-10-10 NOTE — ED Provider Notes (Signed)
[facility] EMERGENCY DEPARTMENT Provider Note   CSN: [id number] Arrival date & time: 10/10/18  8184    History   Chief Complaint Chief Complaint  Patient presents with  . Ankle Injury    HPI Carmen Harper is a 23 y.o. female.     HPI Patient presents to the emergency department with left ankle sprain that occurred just prior to arrival.  Patient that she was walking and stepped wrong and twisted her ankle.  Patient states she is having a lot of pain on the outside part of her ankle.  She states that she took Tylenol and Motrin without significant relief of her symptoms.  Patient states that movement and palpation makes the pain worse. Past Medical History:  Diagnosis Date  . Anxiety   . Asthma     Patient Active Problem List   Diagnosis Date Noted  . Anxiety 04/07/2016  . Vaginal delivery 04/06/2016  . Second-degree perineal laceration, with delivery 04/06/2016  . Premature beats 04/05/2016  . Positive GBS test 04/04/2016  . Asthma 04/04/2016    Past Surgical History:  Procedure Laterality Date  . breast tumor        OB History    Gravida  2   Para  1   Term  1   Preterm      AB      Living  1     SAB      TAB      Ectopic      Multiple  0   Live Births  1            Home Medications    Prior to Admission medications   Medication Sig Start Date End Date Taking? Authorizing Provider  albuterol (VENTOLIN HFA) 108 (90 Base) MCG/ACT inhaler Inhale 1-2 puffs into the lungs every 6 (six) hours as needed for wheezing or shortness of breath.    Yes [provider]  methocarbamol (ROBAXIN) 500 MG tablet Take 1 tablet (500 mg total) by mouth every 8 (eight) hours as needed for muscle spasms. 10/24/17  Yes Long, Arlyss Repress, MD  bacitracin ointment Apply 1 application topically 2 (two) times daily. Apply to wounds on leg 04/29/18   Little, Ambrose Finland, MD  beclomethasone (QVAR) 80 MCG/ACT inhaler Inhale 2 puffs into the lungs  2 (two) times daily.     [provider]  busPIRone HCl (BUSPAR PO) Take by mouth.    [provider]  cetirizine (ZYRTEC ALLERGY) 10 MG tablet Take 1 tablet (10 mg total) by mouth daily. 03/15/17   Everlene Farrier, PA-C  fluticasone (FLONASE) 50 MCG/ACT nasal spray Place 2 sprays into both nostrils daily. 03/15/17   Everlene Farrier, PA-C  naproxen (NAPROSYN) 500 MG tablet Take 1 tablet (500 mg total) by mouth 2 (two) times daily with a meal. 03/15/17   Everlene Farrier, PA-C  oxyCODONE-acetaminophen (PERCOCET) 5-325 MG tablet Take 1 tablet by mouth every 4 (four) hours as needed for moderate pain. 08/26/18   Dione Booze, MD  prochlorperazine (COMPAZINE) 10 MG tablet Take 1 tablet (10 mg total) by mouth 2 (two) times daily as needed for nausea or vomiting. 01/25/17   Tegeler, Canary Brim, MD    Family History Family History  Problem Relation Age of Onset  . Alcohol abuse Neg Hx   . Arthritis Neg Hx   . Asthma Neg Hx   . Birth defects Neg Hx   . Cancer Neg Hx   . COPD  Neg Hx   . Depression Neg Hx   . Diabetes Neg Hx   . Drug abuse Neg Hx   . Early death Neg Hx   . Hearing loss Neg Hx   . Heart disease Neg Hx   . Hyperlipidemia Neg Hx   . Hypertension Neg Hx   . Kidney disease Neg Hx   . Learning disabilities Neg Hx   . Mental illness Neg Hx   . Mental retardation Neg Hx   . Miscarriages / Stillbirths Neg Hx   . Stroke Neg Hx   . Vision loss Neg Hx   . Varicose Veins Neg Hx     Social History Social History   Tobacco Use  . Smoking status: Never Smoker  . Smokeless tobacco: Never Used  Substance Use Topics  . Alcohol use: No  . Drug use: No     Allergies   Sulfa antibiotics   Review of Systems Review of Systems All other systems negative except as documented in the HPI. All pertinent positives and negatives as reviewed in the HPI.  Physical Exam Updated Vital Signs BP 139/83 (BP Location: Left Arm)   Pulse 88   Temp 98.6 F (37 C) (Oral)    Resp 20   Ht 5\' 6"  (1.676 m)   Wt 112.9 kg   SpO2 100%   Breastfeeding Unknown   BMI 40.19 kg/m   Physical Exam Vitals signs and nursing note reviewed.  Constitutional:      General: She is not in acute distress.    Appearance: She is well-developed.  HENT:     Head: Normocephalic and atraumatic.  Eyes:     Pupils: Pupils are equal, round, and reactive to light.  Pulmonary:     Effort: Pulmonary effort is normal.  Musculoskeletal:     Right shoulder: She exhibits decreased range of motion, tenderness, deformity and pain.  Skin:    General: Skin is warm and dry.  Neurological:     Mental Status: She is alert and oriented to person, place, and time.      ED Treatments / Results  Labs (all labs ordered are listed, but only abnormal results are displayed) Labs Reviewed - No data to display  EKG None  Radiology Dg Ankle Complete Left  Result Date: 10/10/2018 CLINICAL DATA:  23 y/o  F; fall with medial malleolus pain. EXAM: LEFT ANKLE COMPLETE - 3+ VIEW COMPARISON:  None. FINDINGS: There is no evidence of fracture, dislocation, or joint effusion. There is no evidence of arthropathy or other focal bone abnormality. IMPRESSION: No acute fracture or dislocation identified. Electronically Signed   By: Mitzi Hansen M.D.   On: 10/10/2018 19:48    Procedures Procedures (including critical care time)  Medications Ordered in ED Medications  HYDROcodone-acetaminophen (NORCO/VICODIN) 5-325 MG per tablet 1 tablet (1 tablet Oral Given 10/10/18 2110)     Initial Impression / Assessment and Plan / ED Course  I have reviewed the triage vital signs and the nursing notes.  Pertinent labs & imaging results that were available during my care of the patient were reviewed by me and considered in my medical decision making (see chart for details).        Patient be treated for ankle sprain.  Placed in a cam walker with crutches.  Advised patient to return here as needed.   Told to follow-up with orthopedics. Final Clinical Impressions(s) / ED Diagnoses   Final diagnoses:  None    ED Discharge Orders  None       Charlestine Night, PA-C 10/10/18 2131    Arby Barrette, MD 10/12/18 [phone]

## 2018-10-10 NOTE — ED Notes (Signed)
ED Provider at bedside. 

## 2018-10-10 NOTE — Discharge Instructions (Signed)
Return here as needed.  Follow-up with the orthopedist provided or 1 of your choosing.  And elevate the ankle.

## 2018-10-10 NOTE — ED Notes (Signed)
Motrin or Tylenol offered but deferred at this time.

## 2018-10-10 NOTE — ED Triage Notes (Signed)
Left ankle injury. This am she fell while putting her baby in a stroller.

## 2019-02-09 ENCOUNTER — Other Ambulatory Visit: Payer: Self-pay

## 2019-02-09 ENCOUNTER — Emergency Department (HOSPITAL_BASED_OUTPATIENT_CLINIC_OR_DEPARTMENT_OTHER): Payer: Medicaid Other

## 2019-02-09 ENCOUNTER — Emergency Department (HOSPITAL_BASED_OUTPATIENT_CLINIC_OR_DEPARTMENT_OTHER)
Admission: EM | Admit: 2019-02-09 | Discharge: 2019-02-09 | Disposition: A | Discharge status: Home / Self Care | Payer: Medicaid Other | Attending: Emergency Medicine | Admitting: Emergency Medicine

## 2019-02-09 ENCOUNTER — Encounter (HOSPITAL_BASED_OUTPATIENT_CLINIC_OR_DEPARTMENT_OTHER): Payer: Self-pay | Admitting: Emergency Medicine

## 2019-02-09 DIAGNOSIS — Y93K1 Activity, walking an animal: Secondary | ICD-10-CM | POA: Insufficient documentation

## 2019-02-09 DIAGNOSIS — J45909 Unspecified asthma, uncomplicated: Secondary | ICD-10-CM | POA: Insufficient documentation

## 2019-02-09 DIAGNOSIS — M545 Low back pain, unspecified: Secondary | ICD-10-CM

## 2019-02-09 DIAGNOSIS — S9032XA Contusion of left foot, initial encounter: Secondary | ICD-10-CM | POA: Insufficient documentation

## 2019-02-09 DIAGNOSIS — Y9289 Other specified places as the place of occurrence of the external cause: Secondary | ICD-10-CM | POA: Insufficient documentation

## 2019-02-09 DIAGNOSIS — S93492A Sprain of other ligament of left ankle, initial encounter: Secondary | ICD-10-CM | POA: Insufficient documentation

## 2019-02-09 DIAGNOSIS — Y999 Unspecified external cause status: Secondary | ICD-10-CM | POA: Insufficient documentation

## 2019-02-09 DIAGNOSIS — W010XXA Fall on same level from slipping, tripping and stumbling without subsequent striking against object, initial encounter: Secondary | ICD-10-CM | POA: Diagnosis not present

## 2019-02-09 DIAGNOSIS — S93402A Sprain of unspecified ligament of left ankle, initial encounter: Secondary | ICD-10-CM

## 2019-02-09 HISTORY — DX: Disorder of thyroid, unspecified: E07.9

## 2019-02-09 LAB — PREGNANCY, URINE: Preg Test, Ur: NEGATIVE

## 2019-02-09 MED ORDER — KETOROLAC TROMETHAMINE 60 MG/2ML IM SOLN
60.0000 mg | Freq: Once | INTRAMUSCULAR | Status: AC
  Administered 2019-02-09: 19:00:00 60 mg via INTRAMUSCULAR
  Filled 2019-02-09: qty 2

## 2019-02-09 MED ORDER — ONDANSETRON 4 MG PO TBDP
4.0000 mg | ORAL_TABLET | Freq: Once | ORAL | Status: AC
  Administered 2019-02-09: 4 mg via ORAL
  Filled 2019-02-09: qty 1

## 2019-02-09 MED ORDER — ACETAMINOPHEN 500 MG PO TABS
1000.0000 mg | ORAL_TABLET | Freq: Once | ORAL | Status: AC
  Administered 2019-02-09: 19:00:00 1000 mg via ORAL
  Filled 2019-02-09: qty 2

## 2019-02-09 NOTE — ED Notes (Signed)
Patient transported to X-ray 

## 2019-02-09 NOTE — Discharge Instructions (Signed)
You may take Tylenol and ibuprofen.  Keep foot elevated and use ice to help control swelling.  Follow-up with your primary provider.

## 2019-02-09 NOTE — ED Notes (Signed)
ED Provider at bedside. 

## 2019-02-09 NOTE — ED Notes (Signed)
Returned from XR, c/o of nausea

## 2019-02-09 NOTE — ED Triage Notes (Signed)
Her dog ran and pulled her down yesterday. C/o L foot and ankle pain and back pain. Bruising noted to her back.

## 2019-02-09 NOTE — ED Provider Notes (Signed)
[facility] EMERGENCY DEPARTMENT Provider Note   CSN: [id number] Arrival date & time: 02/09/19  1612    History   Chief Complaint Chief Complaint  Patient presents with  . Fall    HPI Carmen Harper is a 23 y.o. female.     HPI Patient states that her Randie HeinzGreat Dane pulled her to the ground while he was on the leash.  Thinks that she struck her back and injured her ankle and foot.  Denies head injury or loss of consciousness.  No urinary symptoms.  No numbness.  Patient is asking for pain medication. Past Medical History:  Diagnosis Date  . Anxiety   . Asthma   . Thyroid disease     Patient Active Problem List   Diagnosis Date Noted  . Anxiety 04/07/2016  . Vaginal delivery 04/06/2016  . Second-degree perineal laceration, with delivery 04/06/2016  . Premature beats 04/05/2016  . Positive GBS test 04/04/2016  . Asthma 04/04/2016    Past Surgical History:  Procedure Laterality Date  . breast tumor        OB History    Gravida  2   Para  1   Term  1   Preterm      AB      Living  1     SAB      TAB      Ectopic      Multiple  0   Live Births  1            Home Medications    Prior to Admission medications   Medication Sig Start Date End Date Taking? Authorizing Provider  albuterol (VENTOLIN HFA) 108 (90 Base) MCG/ACT inhaler Inhale 1-2 puffs into the lungs every 6 (six) hours as needed for wheezing or shortness of breath.     [provider]  bacitracin ointment Apply 1 application topically 2 (two) times daily. Apply to wounds on leg 04/29/18   Little, Ambrose Finlandachel Morgan, MD  beclomethasone (QVAR) 80 MCG/ACT inhaler Inhale 2 puffs into the lungs 2 (two) times daily.     [provider]  busPIRone HCl (BUSPAR PO) Take by mouth.    [provider]  cetirizine (ZYRTEC ALLERGY) 10 MG tablet Take 1 tablet (10 mg total) by mouth daily. 03/15/17   Everlene Farrieransie, William, PA-C  fluticasone (FLONASE) 50 MCG/ACT nasal  spray Place 2 sprays into both nostrils daily. 03/15/17   Everlene Farrieransie, William, PA-C  HYDROcodone-acetaminophen (NORCO/VICODIN) 5-325 MG tablet Take 1 tablet by mouth every 6 (six) hours as needed for moderate pain. 10/10/18   Lawyer, Cristal Deerhristopher, PA-C  ibuprofen (ADVIL,MOTRIN) 800 MG tablet Take 1 tablet (800 mg total) by mouth every 8 (eight) hours as needed. 10/10/18   Lawyer, Cristal Deerhristopher, PA-C  methocarbamol (ROBAXIN) 500 MG tablet Take 1 tablet (500 mg total) by mouth every 8 (eight) hours as needed for muscle spasms. 10/24/17   Long, Arlyss RepressJoshua G, MD  naproxen (NAPROSYN) 500 MG tablet Take 1 tablet (500 mg total) by mouth 2 (two) times daily with a meal. 03/15/17   Everlene Farrieransie, William, PA-C  oxyCODONE-acetaminophen (PERCOCET) 5-325 MG tablet Take 1 tablet by mouth every 4 (four) hours as needed for moderate pain. 08/26/18   Dione BoozeGlick, Tanji Storrs, MD  prochlorperazine (COMPAZINE) 10 MG tablet Take 1 tablet (10 mg total) by mouth 2 (two) times daily as needed for nausea or vomiting. 01/25/17   Tegeler, Canary Brimhristopher J, MD    Family History Family History  Problem Relation Age of  Onset  . Alcohol abuse Neg Hx   . Arthritis Neg Hx   . Asthma Neg Hx   . Birth defects Neg Hx   . Cancer Neg Hx   . COPD Neg Hx   . Depression Neg Hx   . Diabetes Neg Hx   . Drug abuse Neg Hx   . Early death Neg Hx   . Hearing loss Neg Hx   . Heart disease Neg Hx   . Hyperlipidemia Neg Hx   . Hypertension Neg Hx   . Kidney disease Neg Hx   . Learning disabilities Neg Hx   . Mental illness Neg Hx   . Mental retardation Neg Hx   . Miscarriages / Stillbirths Neg Hx   . Stroke Neg Hx   . Vision loss Neg Hx   . Varicose Veins Neg Hx     Social History Social History   Tobacco Use  . Smoking status: Never Smoker  . Smokeless tobacco: Never Used  Substance Use Topics  . Alcohol use: No  . Drug use: No     Allergies   Sulfa antibiotics   Review of Systems Review of Systems  Constitutional: Negative for chills and fever.   Musculoskeletal: Positive for arthralgias and back pain. Negative for myalgias.  Skin: Negative for rash and wound.  Neurological: Negative for syncope, weakness, light-headedness, numbness and headaches.  All other systems reviewed and are negative.    Physical Exam Updated Vital Signs BP 132/81 (BP Location: Right Arm)   Pulse (!) 106   Temp 98.6 F (37 C) (Oral)   Resp 18   Ht 5\' 5"  (1.651 m)   Wt 108.9 kg   LMP 02/01/2019   SpO2 98%   BMI 39.94 kg/m   Physical Exam Vitals signs and nursing note reviewed.  Constitutional:      Appearance: Normal appearance. She is well-developed.  HENT:     Head: Normocephalic and atraumatic.  Eyes:     Pupils: Pupils are equal, round, and reactive to light.  Neck:     Musculoskeletal: Normal range of motion and neck supple.  Cardiovascular:     Rate and Rhythm: Normal rate.  Pulmonary:     Effort: Pulmonary effort is normal.  Abdominal:     Palpations: Abdomen is soft.  Musculoskeletal: Normal range of motion.        General: Tenderness present.     Comments: Diffuse midline lumbar tenderness to palpation.  Patient has chronic appearing reticular rash to the back but no obvious bruising.  No step-offs or deformities.  Patient has diffuse tenderness to the left ankle and foot.  There is some bruising over the midfoot.    Skin:    General: Skin is warm and dry.     Findings: No erythema or rash.  Neurological:     General: No focal deficit present.     Mental Status: She is alert and oriented to person, place, and time.     Comments: 5/5 motor in all extremities.  Sensation intact.  No saddle anesthesia.  Psychiatric:        Behavior: Behavior normal.      ED Treatments / Results  Labs (all labs ordered are listed, but only abnormal results are displayed) Labs Reviewed  PREGNANCY, URINE    EKG None  Radiology Dg Lumbar Spine Complete  Result Date: 02/09/2019 CLINICAL DATA:  Severe low back pain and bruising  following a fall onto concrete today. EXAM: LUMBAR SPINE -  COMPLETE 4+ VIEW COMPARISON:  10/28/2016. FINDINGS: Five non-rib-bearing lumbar vertebrae. Minimal thoracolumbar scoliosis. No fractures, pars defects or subluxations. IMPRESSION: No fracture or subluxation. Electronically Signed   By: Claudie Revering M.D.   On: 02/09/2019 18:35   Dg Ankle Complete Left  Result Date: 02/09/2019 CLINICAL DATA:  Severe left ankle pain, bruising and swelling after falling on concrete today. EXAM: LEFT ANKLE COMPLETE - 3+ VIEW COMPARISON:  10/10/2018. FINDINGS: Mild diffuse soft tissue swelling. No fracture, dislocation or effusion seen. IMPRESSION: No fracture. Electronically Signed   By: Claudie Revering M.D.   On: 02/09/2019 18:37   Dg Foot Complete Left  Result Date: 02/09/2019 CLINICAL DATA:  Severe diffuse left foot pain and bruising following a fall today. EXAM: LEFT FOOT - COMPLETE 3+ VIEW COMPARISON:  Left ankle radiographs obtained today. FINDINGS: There is no evidence of fracture or dislocation. There is no evidence of arthropathy or other focal bone abnormality. Soft tissues are unremarkable. IMPRESSION: Normal examination. Electronically Signed   By: Claudie Revering M.D.   On: 02/09/2019 18:38    Procedures Procedures (including critical care time)  Medications Ordered in ED Medications  ondansetron (ZOFRAN-ODT) disintegrating tablet 4 mg (4 mg Oral Given 02/09/19 1851)  acetaminophen (TYLENOL) tablet 1,000 mg (1,000 mg Oral Given 02/09/19 1851)  ketorolac (TORADOL) injection 60 mg (60 mg Intramuscular Given 02/09/19 1852)     Initial Impression / Assessment and Plan / ED Course  I have reviewed the triage vital signs and the nursing notes.  Pertinent labs & imaging results that were available during my care of the patient were reviewed by me and considered in my medical decision making (see chart for details).        Review of patient's records show that she was seen by her primary physician yesterday.   She states that she tripped on 7/7 going after  her 61-year-old and landed on her back.  She was requesting pain medication at that time.  Had x-rays ordered of her hand, sacrum, lumbar and thoracic spine without evidence of fracture.  Also had MRI ordered.  Was told that narcotic medication would not be given at that time. Patient appears to have drug-seeking behavior with multiple musculoskeletal complaints requesting narcotic medication of the past 7 months.  Will screen with x-rays and offer nonnarcotics.  No evidence of fracture.  Ace wrap left ankle and foot.  No evidence of cauda equina.  Have advised Tylenol and ibuprofen as well as rice treatment.  Return precautions given. Final Clinical Impressions(s) / ED Diagnoses   Final diagnoses:  Acute midline low back pain without sciatica  Sprain of left ankle, unspecified ligament, initial encounter  Contusion of left foot, initial encounter    ED Discharge Orders    None       Julianne Rice, MD 02/09/19 1858

## 2019-02-26 ENCOUNTER — Other Ambulatory Visit: Payer: Self-pay

## 2019-02-26 ENCOUNTER — Encounter (HOSPITAL_BASED_OUTPATIENT_CLINIC_OR_DEPARTMENT_OTHER): Payer: Self-pay | Admitting: *Deleted

## 2019-02-26 ENCOUNTER — Emergency Department (HOSPITAL_BASED_OUTPATIENT_CLINIC_OR_DEPARTMENT_OTHER)
Admission: EM | Admit: 2019-02-26 | Discharge: 2019-02-26 | Discharge status: Against Medical Advice | Payer: Medicaid Other | Attending: Emergency Medicine | Admitting: Emergency Medicine

## 2019-02-26 DIAGNOSIS — G894 Chronic pain syndrome: Secondary | ICD-10-CM

## 2019-02-26 DIAGNOSIS — R400 Somnolence: Secondary | ICD-10-CM | POA: Insufficient documentation

## 2019-02-26 DIAGNOSIS — Z5320 Procedure and treatment not carried out because of patient's decision for unspecified reasons: Secondary | ICD-10-CM | POA: Diagnosis not present

## 2019-02-26 DIAGNOSIS — M25572 Pain in left ankle and joints of left foot: Secondary | ICD-10-CM | POA: Insufficient documentation

## 2019-02-26 DIAGNOSIS — Y998 Other external cause status: Secondary | ICD-10-CM | POA: Insufficient documentation

## 2019-02-26 DIAGNOSIS — M25571 Pain in right ankle and joints of right foot: Secondary | ICD-10-CM | POA: Diagnosis present

## 2019-02-26 DIAGNOSIS — Y9389 Activity, other specified: Secondary | ICD-10-CM | POA: Insufficient documentation

## 2019-02-26 DIAGNOSIS — Y929 Unspecified place or not applicable: Secondary | ICD-10-CM | POA: Diagnosis not present

## 2019-02-26 DIAGNOSIS — J45909 Unspecified asthma, uncomplicated: Secondary | ICD-10-CM | POA: Insufficient documentation

## 2019-02-26 DIAGNOSIS — M79642 Pain in left hand: Secondary | ICD-10-CM | POA: Diagnosis not present

## 2019-02-26 DIAGNOSIS — W19XXXA Unspecified fall, initial encounter: Secondary | ICD-10-CM | POA: Diagnosis not present

## 2019-02-26 NOTE — ED Triage Notes (Addendum)
Pt reports she was outside chasing her 24 year old and fell hurting both ankles and her left hand. Pt drowsy, having trouble staying awake during triage. She states she didn't get much sleep last night. Also reports she took five extra strength tylenol and 5 800mg  ibuprofen at 4 pm

## 2019-02-26 NOTE — ED Provider Notes (Signed)
[facility] Provider Note   CSN: [id number] Arrival date & time: 02/26/19  2016    History   Chief Complaint Chief Complaint  Patient presents with  . Fall    HPI Carmen Harper is a 23 y.o. female.     HPI Patient presents with bilateral ankle and left hand pain.  States she fell while chasing her 57-year-old.  She is drowsy and states she has not been sleeping well.  Patient states she had hydrocodone but that pills were accidentally knocked down the drain.  Tells nurse that she has been taking 5 extra strength Tylenol and 5 800 mg ibuprofen 4 times daily for the past 5 days.  States she is not trying to harm herself.  States she is taking this because her pain is uncontrolled.  She denies nausea, vomiting or abdominal pain. Past Medical History:  Diagnosis Date  . Anxiety   . Asthma   . Thyroid disease     Patient Active Problem List   Diagnosis Date Noted  . Anxiety 04/07/2016  . Vaginal delivery 04/06/2016  . Second-degree perineal laceration, with delivery 04/06/2016  . Premature beats 04/05/2016  . Positive GBS test 04/04/2016  . Asthma 04/04/2016    Past Surgical History:  Procedure Laterality Date  . breast tumor        OB History    Gravida  2   Para  1   Term  1   Preterm      AB      Living  1     SAB      TAB      Ectopic      Multiple  0   Live Births  1            Home Medications    Prior to Admission medications   Medication Sig Start Date End Date Taking? Authorizing Provider  albuterol (VENTOLIN HFA) 108 (90 Base) MCG/ACT inhaler Inhale 1-2 puffs into the lungs every 6 (six) hours as needed for wheezing or shortness of breath.     [provider]  bacitracin ointment Apply 1 application topically 2 (two) times daily. Apply to wounds on leg 04/29/18   Little, Wenda Overland, MD  beclomethasone (QVAR) 80 MCG/ACT inhaler Inhale 2 puffs into the lungs 2 (two) times daily.      [provider]  busPIRone HCl (BUSPAR PO) Take by mouth.    [provider]  cetirizine (ZYRTEC ALLERGY) 10 MG tablet Take 1 tablet (10 mg total) by mouth daily. 03/15/17   Waynetta Pean, PA-C  fluticasone (FLONASE) 50 MCG/ACT nasal spray Place 2 sprays into both nostrils daily. 03/15/17   Waynetta Pean, PA-C  HYDROcodone-acetaminophen (NORCO/VICODIN) 5-325 MG tablet Take 1 tablet by mouth every 6 (six) hours as needed for moderate pain. 10/10/18   Lawyer, Harrell Gave, PA-C  ibuprofen (ADVIL,MOTRIN) 800 MG tablet Take 1 tablet (800 mg total) by mouth every 8 (eight) hours as needed. 10/10/18   Lawyer, Harrell Gave, PA-C  methocarbamol (ROBAXIN) 500 MG tablet Take 1 tablet (500 mg total) by mouth every 8 (eight) hours as needed for muscle spasms. 10/24/17   Long, Wonda Olds, MD  naproxen (NAPROSYN) 500 MG tablet Take 1 tablet (500 mg total) by mouth 2 (two) times daily with a meal. 03/15/17   Waynetta Pean, PA-C  oxyCODONE-acetaminophen (PERCOCET) 5-325 MG tablet Take 1 tablet by mouth every 4 (four) hours as needed for moderate pain. 11/10/79   Roxanne Mins,  Onalee Huaavid, MD  prochlorperazine (COMPAZINE) 10 MG tablet Take 1 tablet (10 mg total) by mouth 2 (two) times daily as needed for nausea or vomiting. 01/25/17   Tegeler, Canary Brimhristopher J, MD    Family History Family History  Problem Relation Age of Onset  . Alcohol abuse Neg Hx   . Arthritis Neg Hx   . Asthma Neg Hx   . Birth defects Neg Hx   . Cancer Neg Hx   . COPD Neg Hx   . Depression Neg Hx   . Diabetes Neg Hx   . Drug abuse Neg Hx   . Early death Neg Hx   . Hearing loss Neg Hx   . Heart disease Neg Hx   . Hyperlipidemia Neg Hx   . Hypertension Neg Hx   . Kidney disease Neg Hx   . Learning disabilities Neg Hx   . Mental illness Neg Hx   . Mental retardation Neg Hx   . Miscarriages / Stillbirths Neg Hx   . Stroke Neg Hx   . Vision loss Neg Hx   . Varicose Veins Neg Hx     Social History Social History   Tobacco Use  .  Smoking status: Never Smoker  . Smokeless tobacco: Never Used  Substance Use Topics  . Alcohol use: No  . Drug use: No     Allergies   Sulfa antibiotics   Review of Systems Review of Systems  Constitutional: Negative for chills and fever.  Gastrointestinal: Negative for abdominal pain, nausea and vomiting.  Musculoskeletal: Positive for arthralgias and back pain.  Skin: Negative for rash and wound.  Neurological: Negative for syncope, weakness and numbness.  All other systems reviewed and are negative.    Physical Exam Updated Vital Signs BP 119/85 (BP Location: Right Arm)   Pulse (!) 105   Temp 98.9 F (37.2 C) (Oral)   Resp 20   Ht 5\' 5"  (1.651 m)   Wt 108.9 kg   LMP 02/01/2019   SpO2 100%   BMI 39.94 kg/m   Physical Exam Vitals signs and nursing note reviewed.  Constitutional:      Appearance: She is well-developed.     Comments: Drowsy but arousable.  HENT:     Head: Normocephalic and atraumatic.  Eyes:     Extraocular Movements: Extraocular movements intact.     Pupils: Pupils are equal, round, and reactive to light.     Comments: Pupils are 4 mm and sluggish.  Neck:     Musculoskeletal: Normal range of motion and neck supple. No neck rigidity or muscular tenderness.  Cardiovascular:     Rate and Rhythm: Normal rate and regular rhythm.  Pulmonary:     Effort: Pulmonary effort is normal.     Breath sounds: Normal breath sounds.  Abdominal:     General: Bowel sounds are normal.     Palpations: Abdomen is soft.     Tenderness: There is no abdominal tenderness. There is no guarding or rebound.  Musculoskeletal: Normal range of motion.        General: Tenderness present.     Comments: Diffuse lumbar tenderness without focality or step-offs.  Pelvis is stable.  Patient has scattered contusions to bilateral upper and lower extremities.  Appears to have diffuse tenderness without focality.  No ligamentous instability.  No deformity.  Lymphadenopathy:      Cervical: No cervical adenopathy.  Skin:    General: Skin is warm and dry.     Findings: No erythema  or rash.  Neurological:     General: No focal deficit present.     Mental Status: She is alert and oriented to person, place, and time.     Comments: Moves all extremities without focal deficit.  Sensation intact.  Psychiatric:        Behavior: Behavior normal.     Comments: Denies suicidal ideation.  Denies domestic abuse.      ED Treatments / Results  Labs (all labs ordered are listed, but only abnormal results are displayed) Labs Reviewed  CBC WITH DIFFERENTIAL/PLATELET  COMPREHENSIVE METABOLIC PANEL  RAPID URINE DRUG SCREEN, HOSP PERFORMED  SALICYLATE LEVEL  ACETAMINOPHEN LEVEL  URINALYSIS, ROUTINE W REFLEX MICROSCOPIC  PREGNANCY, URINE    EKG None  Radiology No results found.  Procedures Procedures (including critical care time)  Medications Ordered in ED Medications - No data to display   Initial Impression / Assessment and Plan / ED Course  I have reviewed the triage vital signs and the nursing notes.  Pertinent labs & imaging results that were available during my care of the patient were reviewed by me and considered in my medical decision making (see chart for details).        I saw this patient 2 weeks ago for suspicious injuries and inconsistent stories.  She is been seen multiple times for varying orthopediccomplaints.  Has had multiple x-rays with no acute findings.  She admits to misusing home ibuprofen and Tylenol.  She is requesting "strong" pain medication here.  I have told her that her presentation is suspicious for drug-seeking behavior and opiate abuse.  Do not believe that emergent imaging is necessary at this time.  I would like to screen for possible Tylenol toxicity with labs.  Patient is refusing this at this time.  She will leave AGAINST MEDICAL ADVICE.  Would strongly caution against prescribing this patient narcotics in the future.   Final Clinical Impressions(s) / ED Diagnoses   Final diagnoses:  Chronic pain syndrome    ED Discharge Orders    None       Loren RacerYelverton, Marquette Piontek, MD 02/26/19 2207

## 2019-02-26 NOTE — ED Notes (Signed)
Pt sts that her hydrocodone bottle was knocked over and down the drain on Tues; she started taking ES Tylenol x 5 + ibuprofen 800mg  x 5 on Tues pm and has taken that same dose 4 times daily since. EDP made aware.

## 2019-02-26 NOTE — ED Notes (Addendum)
Rechecked with patient regarding refusal of lab/urine tests, pt continues to state that she does not want labs checked. States that her mother is watching her kids and is sick, "I dont want to do that to her." Explained risks of tylenol/ibuprofen overdose and informed patient to discontinue those medications. Explained symptoms of overdose of tylenol and ibuprofen, provided printed literature with this information. Encouraged patient to follow up with PCP or return to the ED as soon as she can. Pt upset because she is requesting pain medications and ED provider will not give narcotic medications. States "now I have to suffer because I can't take tylenol and it wasn't working anyway." reinforced that if she allowed [place] to check labs, we could evaluate whether she took too much medication and find out if she can take tylenol or motrin. Pt continues to refuse. Signed AMA documentation, pt escorted out of ED via wheelchair by this RN.

## 2019-02-26 NOTE — ED Notes (Signed)
In with patient to explain that lab work and urine has been ordered. Pt states that we are not treating her pain and she does not want to stay to have lab work obtained. Spoke with patient at length regarding the importance of having this lab work drawn if she had taken extra tylenol and ibuprofen as she reported to the MD and nurse. Pt states that she understands what I am saying, but declines to have blood work or urine collected.

## 2019-02-26 NOTE — ED Notes (Signed)
EDP at bedside, pt wanting to leave AMA.

## 2019-03-27 ENCOUNTER — Emergency Department
Admission: EM | Admit: 2019-03-27 | Discharge: 2019-03-27 | Disposition: A | Discharge status: Home / Self Care | Payer: Medicaid Other | Attending: Emergency Medicine | Admitting: Emergency Medicine

## 2019-03-27 ENCOUNTER — Encounter: Payer: Self-pay | Admitting: Emergency Medicine

## 2019-03-27 ENCOUNTER — Other Ambulatory Visit: Payer: Self-pay

## 2019-03-27 ENCOUNTER — Emergency Department: Payer: Medicaid Other

## 2019-03-27 DIAGNOSIS — Z79899 Other long term (current) drug therapy: Secondary | ICD-10-CM | POA: Diagnosis not present

## 2019-03-27 DIAGNOSIS — J45909 Unspecified asthma, uncomplicated: Secondary | ICD-10-CM | POA: Diagnosis not present

## 2019-03-27 DIAGNOSIS — N390 Urinary tract infection, site not specified: Secondary | ICD-10-CM | POA: Diagnosis not present

## 2019-03-27 DIAGNOSIS — R51 Headache: Secondary | ICD-10-CM | POA: Diagnosis present

## 2019-03-27 DIAGNOSIS — R410 Disorientation, unspecified: Secondary | ICD-10-CM | POA: Insufficient documentation

## 2019-03-27 LAB — URINALYSIS, COMPLETE (UACMP) WITH MICROSCOPIC
Bilirubin Urine: NEGATIVE
Glucose, UA: NEGATIVE mg/dL
Hgb urine dipstick: NEGATIVE
Ketones, ur: NEGATIVE mg/dL
Nitrite: NEGATIVE
Protein, ur: NEGATIVE mg/dL
Specific Gravity, Urine: 1.004 — ABNORMAL LOW (ref 1.005–1.030)
pH: 7 (ref 5.0–8.0)

## 2019-03-27 LAB — COMPREHENSIVE METABOLIC PANEL
ALT: 22 U/L (ref 0–44)
AST: 24 U/L (ref 15–41)
Albumin: 4.1 g/dL (ref 3.5–5.0)
Alkaline Phosphatase: 61 U/L (ref 38–126)
Anion gap: 9 (ref 5–15)
BUN: 7 mg/dL (ref 6–20)
CO2: 24 mmol/L (ref 22–32)
Calcium: 8.9 mg/dL (ref 8.9–10.3)
Chloride: 105 mmol/L (ref 98–111)
Creatinine, Ser: 0.71 mg/dL (ref 0.44–1.00)
GFR calc Af Amer: >60 mL/min (ref >60)
GFR calc non Af Amer: >60 mL/min (ref >60)
Glucose, Bld: 110 mg/dL — ABNORMAL HIGH (ref 70–99)
Potassium: 3 mmol/L — ABNORMAL LOW (ref 3.5–5.1)
Sodium: 138 mmol/L (ref 135–145)
Total Bilirubin: 0.6 mg/dL (ref 0.3–1.2)
Total Protein: 7 g/dL (ref 6.5–8.1)

## 2019-03-27 LAB — ETHANOL: Alcohol, Ethyl (B): <10 mg/dL (ref <10)

## 2019-03-27 LAB — CBC
HCT: 33.6 % — ABNORMAL LOW (ref 36.0–46.0)
Hemoglobin: 10.8 g/dL — ABNORMAL LOW (ref 12.0–15.0)
MCH: 25.2 pg — ABNORMAL LOW (ref 26.0–34.0)
MCHC: 32.1 g/dL (ref 30.0–36.0)
MCV: 78.5 fL — ABNORMAL LOW (ref 80.0–100.0)
Platelets: 206 10*3/uL (ref 150–400)
RBC: 4.28 MIL/uL (ref 3.87–5.11)
RDW: 13.8 % (ref 11.5–15.5)
WBC: 3.9 10*3/uL — ABNORMAL LOW (ref 4.0–10.5)
nRBC: 0 % (ref 0.0–0.2)

## 2019-03-27 LAB — DIFFERENTIAL
Abs Immature Granulocytes: 0.01 10*3/uL (ref 0.00–0.07)
Basophils Absolute: 0 10*3/uL (ref 0.0–0.1)
Basophils Relative: 1 %
Eosinophils Absolute: 0 10*3/uL (ref 0.0–0.5)
Eosinophils Relative: 1 %
Immature Granulocytes: 0 %
Lymphocytes Relative: 30 %
Lymphs Abs: 1.2 10*3/uL (ref 0.7–4.0)
Monocytes Absolute: 0.3 10*3/uL (ref 0.1–1.0)
Monocytes Relative: 9 %
Neutro Abs: 2.3 10*3/uL (ref 1.7–7.7)
Neutrophils Relative %: 59 %

## 2019-03-27 LAB — POCT PREGNANCY, URINE: Preg Test, Ur: NEGATIVE

## 2019-03-27 LAB — PROTIME-INR
INR: 1.1 (ref 0.8–1.2)
Prothrombin Time: 13.6 seconds (ref 11.4–15.2)

## 2019-03-27 LAB — AMMONIA: Ammonia: <9 umol/L — ABNORMAL LOW (ref 9–35)

## 2019-03-27 MED ORDER — LORAZEPAM 2 MG/ML IJ SOLN
1.0000 mg | Freq: Once | INTRAMUSCULAR | Status: AC
  Administered 2019-03-27: 1 mg via INTRAVENOUS
  Filled 2019-03-27: qty 1

## 2019-03-27 MED ORDER — CEPHALEXIN 500 MG PO CAPS: 500.0000 mg | ORAL_CAPSULE | Freq: Three times a day (TID) | ORAL | 0 refills | Status: AC

## 2019-03-27 MED ORDER — KETOROLAC TROMETHAMINE 30 MG/ML IJ SOLN
15.0000 mg | Freq: Once | INTRAMUSCULAR | Status: AC
  Administered 2019-03-27: 15 mg via INTRAVENOUS
  Filled 2019-03-27: qty 1

## 2019-03-27 MED ORDER — LEVETIRACETAM 500 MG PO TABS: 500.0000 mg | ORAL_TABLET | Freq: Every day | ORAL | 0 refills | Status: AC

## 2019-03-27 MED ORDER — DIPHENHYDRAMINE HCL 50 MG/ML IJ SOLN
25.0000 mg | Freq: Once | INTRAMUSCULAR | Status: AC
  Administered 2019-03-27: 21:00:00 25 mg via INTRAVENOUS
  Filled 2019-03-27: qty 1

## 2019-03-27 MED ORDER — METOCLOPRAMIDE HCL 5 MG/ML IJ SOLN
10.0000 mg | Freq: Once | INTRAMUSCULAR | Status: AC
  Administered 2019-03-27: 21:00:00 10 mg via INTRAVENOUS
  Filled 2019-03-27: qty 2

## 2019-03-27 MED ORDER — NAPROXEN 375 MG PO TABS: 375.0000 mg | ORAL_TABLET | Freq: Two times a day (BID) | ORAL | 0 refills | Status: AC

## 2019-03-27 NOTE — Discharge Instructions (Signed)
Your lab work today showed a possible UTI.  Take the antibiotic as prescribed.  This could be causing breakthrough seizures.  For at least the next week, take Keppra twice a day instead of once a day.  I prescribed 7 doses to take at night in addition to your usual morning doses.  This will be for a total of 2 tablets of Keppra daily.  Call your neurologist tomorrow to discuss.

## 2019-03-27 NOTE — ED Triage Notes (Signed)
Pt presents to ED via [facility] with c/o HA at this time. Per EMS pt has had HA x 5 days, per EMS pt's family also reports "abnormal behavior" and confusion. Per EMS pt was in [place] earlier today, however could not remember how she got there. Per EMS VSS, pt with hx of drug abuse. EMS reports pt A&O x 4 and ambulatory at this time.

## 2019-03-27 NOTE — ED Triage Notes (Signed)
Patient to ER for c/o severe HA and episodes of forgetfulness. Patient states she has had HA for 5 days, "my whole body hurts and has bruises all over it". Patient states last night her daughter and fiance tried to wake her up and couldn't. Patient also states that she has had episodes for approx one week where she has periods of time she can't remember. Patient states yesterday she went to her old apartment (patient just moved to a new home), doesn't remember the time period from getting out of her car until getting back into her car (maybe an hour worth of time). Patient does have h/o epilepsy and hit head approx 2 days ago as result. Patient denies any known fevers. Patient's speech clear, A&Ox3 (unable to tell me the year).
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
## 2019-03-27 NOTE — ED Notes (Signed)
Pt request this RN wheel her outside to wait on her significant other. Pt encouraged to stay in room until he arrives; pt refuses to wait in room. Pt of sound mind

## 2019-03-30 ENCOUNTER — Emergency Department: Payer: Medicaid Other

## 2019-03-30 ENCOUNTER — Other Ambulatory Visit: Payer: Self-pay

## 2019-03-30 ENCOUNTER — Emergency Department
Admission: EM | Admit: 2019-03-30 | Discharge: 2019-03-30 | Disposition: A | Discharge status: Home / Self Care | Payer: Medicaid Other | Attending: Emergency Medicine | Admitting: Emergency Medicine

## 2019-03-30 DIAGNOSIS — F1310 Sedative, hypnotic or anxiolytic abuse, uncomplicated: Secondary | ICD-10-CM | POA: Diagnosis not present

## 2019-03-30 DIAGNOSIS — S6992XA Unspecified injury of left wrist, hand and finger(s), initial encounter: Secondary | ICD-10-CM | POA: Diagnosis present

## 2019-03-30 DIAGNOSIS — J45909 Unspecified asthma, uncomplicated: Secondary | ICD-10-CM | POA: Diagnosis not present

## 2019-03-30 DIAGNOSIS — Y999 Unspecified external cause status: Secondary | ICD-10-CM | POA: Diagnosis not present

## 2019-03-30 DIAGNOSIS — Y939 Activity, unspecified: Secondary | ICD-10-CM | POA: Insufficient documentation

## 2019-03-30 DIAGNOSIS — S60222A Contusion of left hand, initial encounter: Secondary | ICD-10-CM | POA: Insufficient documentation

## 2019-03-30 DIAGNOSIS — Z79899 Other long term (current) drug therapy: Secondary | ICD-10-CM | POA: Diagnosis not present

## 2019-03-30 DIAGNOSIS — Y92002 Bathroom of unspecified non-institutional (private) residence single-family (private) house as the place of occurrence of the external cause: Secondary | ICD-10-CM | POA: Diagnosis not present

## 2019-03-30 DIAGNOSIS — S9032XA Contusion of left foot, initial encounter: Secondary | ICD-10-CM

## 2019-03-30 DIAGNOSIS — W010XXA Fall on same level from slipping, tripping and stumbling without subsequent striking against object, initial encounter: Secondary | ICD-10-CM | POA: Diagnosis not present

## 2019-03-30 LAB — URINE DRUG SCREEN, QUALITATIVE (ARMC ONLY)
Amphetamines, Ur Screen: NOT DETECTED
Barbiturates, Ur Screen: NOT DETECTED
Benzodiazepine, Ur Scrn: POSITIVE — AB
Cannabinoid 50 Ng, Ur ~~LOC~~: NOT DETECTED
Cocaine Metabolite,Ur ~~LOC~~: NOT DETECTED
MDMA (Ecstasy)Ur Screen: NOT DETECTED
Methadone Scn, Ur: NOT DETECTED
Opiate, Ur Screen: NOT DETECTED
Phencyclidine (PCP) Ur S: NOT DETECTED
Tricyclic, Ur Screen: NOT DETECTED

## 2019-03-30 LAB — POCT PREGNANCY, URINE: Preg Test, Ur: NEGATIVE

## 2019-03-30 MED ORDER — KETOROLAC TROMETHAMINE 10 MG PO TABS
10.0000 mg | ORAL_TABLET | Freq: Once | ORAL | Status: AC
  Administered 2019-03-30: 05:00:00 10 mg via ORAL
  Filled 2019-03-30: qty 1

## 2019-03-30 NOTE — ED Notes (Signed)
Pt called out. PT states she needs to leave regardless of lack of transportation. PT told she needed to have a safe ride due to her current states. Pt is alert and oriented x 4 but still remains sluggish. Pt states she refuses to wait and is going to leave regardless of her lack of transportation and regardless of leaving in her current states. Pt is not under involuntary commitment and arrived voluntarily. PT was given all discharge instructions and had no questions. Pt did sign that she received all discharge instructions.

## 2019-03-30 NOTE — ED Triage Notes (Signed)
Pt states she fell after tripping with a toy a few hours ago. Co pain to left foot and left hand, bruising and swelling noted to both.

## 2019-03-30 NOTE — ED Notes (Signed)
Pt family member contacted. Will arrive in 53mins for pickup. Pt will remain in room until ride arrives. Will continue to monitor.

## 2019-03-30 NOTE — ED Notes (Addendum)
In with Dr Owens Shark to assess pt. Pt is laying over to the right in the wheelchair with her left foot placed on top of the stretcher. Pt did not wake up on staff entering or door closing. Pt had to be tapped several times. Pt speech is slow and low and difficult to understand. Pt admits to taking 2 mg of xanax with her evening meds at 2200. Pt denies any narcotics or other drug use. Pt placed on stretcher with bp cuff and pulse ox attached for safety.

## 2019-03-30 NOTE — ED Notes (Signed)
Asked pt to call her ride so she can be discharged. Pt is heavily sedated. Pt pupils are still dilated. Asked pt where she got her xanax as a RX can't be verified. Pt unable to answer. Pt says she took what they gave her.

## 2019-03-30 NOTE — ED Notes (Signed)
Pt given discharge instructions and assisted to call family. PT unable to find her phone. PT states she is going to wait in her car until her family arrives. PT told we would take her to her family once they arrived due to her current states of being "not in her right mind;" per pt own report. PT does appear "sluggish."

## 2019-03-30 NOTE — ED Notes (Signed)
Urine preg is negative  

## 2019-03-30 NOTE — ED Notes (Signed)
Pt to the er for pain to the left hand and the left foot. Pt has swelling with old bruising to the left hand and swelling with old bruising to the left foot. Pt speech is slow and pupils are dilated. Pt states she fell down the flight of steps while moving a dresser with her fiance. Pt states she slipped on a toy and fell. Pt states she is unable to move her fingers. Good radial pulse palpated and good pedal pulse palpated.

## 2019-03-30 NOTE — ED Provider Notes (Signed)
[facility] Emergency Department Provider Note __   First MD Initiated Contact with Patient 03/30/19 [phone]     (approximate)  I have reviewed the triage vital signs and the nursing notes.  History and physical exam limited secondary to altered mental status   HISTORY  Chief Complaint Fall  HPI Carmen Harper is a 23 y.o. female with below list of previous medical conditions presents to the emergency department secondary to history of "tripping over her toilet tonight with resultant left hand and left foot pain swelling and bruising.  Patient denies any head injury no loss of consciousness.  Patient markedly somnolent and appears to be under the influence of substance during my evaluation and as such I asked her what medication she took tonight.  Patient initially answered that she took 1 tablet of 0.5 mg of Xanax.  When I urged the patient to please tell me the truth she subsequently stated that she actually took 4 Xanax "because I could not get my nerves under control".  Patient denies any suicidal ideation      Past Medical History:  Diagnosis Date  . Anxiety   . Asthma   . Thyroid disease     Patient Active Problem List   Diagnosis Date Noted  . Anxiety 04/07/2016  . Vaginal delivery 04/06/2016  . Second-degree perineal laceration, with delivery 04/06/2016  . Premature beats 04/05/2016  . Positive GBS test 04/04/2016  . Asthma 04/04/2016    Past Surgical History:  Procedure Laterality Date  . breast tumor       Prior to Admission medications   Medication Sig Start Date End Date Taking? Authorizing Provider  albuterol (VENTOLIN HFA) 108 (90 Base) MCG/ACT inhaler Inhale 1-2 puffs into the lungs every 6 (six) hours as needed for wheezing or shortness of breath.     [provider]  bacitracin ointment Apply 1 application topically 2 (two) times daily. Apply to wounds on leg 04/29/18   Little, Ambrose Finland, MD  beclomethasone (QVAR) 80  MCG/ACT inhaler Inhale 2 puffs into the lungs 2 (two) times daily.     [provider]  busPIRone HCl (BUSPAR PO) Take by mouth.    [provider]  cephALEXin (KEFLEX) 500 MG capsule Take 1 capsule (500 mg total) by mouth 3 (three) times daily for 7 days. 03/27/19 04/03/19  Shaune Pollack, MD  cetirizine (ZYRTEC ALLERGY) 10 MG tablet Take 1 tablet (10 mg total) by mouth daily. 03/15/17   Everlene Farrier, PA-C  fluticasone (FLONASE) 50 MCG/ACT nasal spray Place 2 sprays into both nostrils daily. 03/15/17   Everlene Farrier, PA-C  HYDROcodone-acetaminophen (NORCO/VICODIN) 5-325 MG tablet Take 1 tablet by mouth every 6 (six) hours as needed for moderate pain. 10/10/18   Lawyer, Cristal Deer, PA-C  levETIRAcetam (KEPPRA) 500 MG tablet Take 1 tablet (500 mg total) by mouth at bedtime for 7 days. 03/27/19 04/03/19  Shaune Pollack, MD  methocarbamol (ROBAXIN) 500 MG tablet Take 1 tablet (500 mg total) by mouth every 8 (eight) hours as needed for muscle spasms. 10/24/17   Long, Arlyss Repress, MD  naproxen (NAPROSYN) 375 MG tablet Take 1 tablet (375 mg total) by mouth 2 (two) times daily with a meal for 5 days. 03/27/19 04/01/19  Shaune Pollack, MD  prochlorperazine (COMPAZINE) 10 MG tablet Take 1 tablet (10 mg total) by mouth 2 (two) times daily as needed for nausea or vomiting. 01/25/17   Tegeler, Canary Brim, MD    Allergies Sulfa antibiotics  Family History  Problem Relation Age of Onset  . Alcohol abuse Neg Hx   . Arthritis Neg Hx   . Asthma Neg Hx   . Birth defects Neg Hx   . Cancer Neg Hx   . COPD Neg Hx   . Depression Neg Hx   . Diabetes Neg Hx   . Drug abuse Neg Hx   . Early death Neg Hx   . Hearing loss Neg Hx   . Heart disease Neg Hx   . Hyperlipidemia Neg Hx   . Hypertension Neg Hx   . Kidney disease Neg Hx   . Learning disabilities Neg Hx   . Mental illness Neg Hx   . Mental retardation Neg Hx   . Miscarriages / Stillbirths Neg Hx   . Stroke Neg Hx   . Vision loss Neg Hx    . Varicose Veins Neg Hx     Social History Social History   Tobacco Use  . Smoking status: Never Smoker  . Smokeless tobacco: Never Used  Substance Use Topics  . Alcohol use: No  . Drug use: No    Review of Systems Constitutional: No fever/chills Eyes: No visual changes. ENT: No sore throat. Cardiovascular: Denies chest pain. Respiratory: Denies shortness of breath. Gastrointestinal: No abdominal pain.  No nausea, no vomiting.  No diarrhea.  No constipation. Genitourinary: Negative for dysuria. Musculoskeletal: Negative for neck pain.  Negative for back pain. Integumentary: Negative for rash. Neurological: Negative for headaches, focal weakness or numbness. Psychiatric:  Positive for benzodiazepine overdose   ____________________________________________   PHYSICAL EXAM:  VITAL SIGNS: ED Triage Vitals [03/30/19 0419]  Enc Vitals Group     BP 124/79     Pulse Rate 80     Resp 20     Temp 97.7 F (36.5 C)     Temp Source Oral     SpO2 100 %     Weight 108.9 kg (240 lb)     Height 1.651 m (5\' 5" )     Head Circumference      Peak Flow      Pain Score 8     Pain Loc      Pain Edu?      Excl. in GC?     Constitutional: Somnolent but responds to verbal stimuli Eyes: Conjunctivae are normal. Dilated pupils Head: Atraumatic. Mouth/Throat: Mucous membranes are moist. Neck: No stridor.  No meningeal signs.   Cardiovascular: Normal rate, regular rhythm. Good peripheral circulation. Grossly normal heart sounds. Respiratory: Normal respiratory effort.  No retractions. Gastrointestinal: Soft and nontender. No distention.  Musculoskeletal: No lower extremity tenderness nor edema. No gross deformities of extremities. Neurologic:  Normal speech and language. No gross focal neurologic deficits are appreciated.  Skin:  Skin is warm, dry and intact. Psychiatric: Appears intoxicated, slurred speech  ____________________________________________   LABS (all labs ordered  are listed, but only abnormal results are displayed)  Labs Reviewed  URINE DRUG SCREEN, QUALITATIVE ([facility] ONLY) - Abnormal; Notable for the following components:      Result Value   Benzodiazepine, Ur Scrn POSITIVE (*)    All other components within normal limits  POC URINE PREG, ED  POCT PREGNANCY, URINE   _________________________________  RADIOLOGY I, [place] BROWN, personally viewed and evaluated these images (plain radiographs) as part of my medical decision making, as well as reviewing the written report by the radiologist.  ED MD interpretation:    Official radiology report(s): Dg Hand Complete Left  Result  Date: 03/30/2019 CLINICAL DATA:  Fall with hand trauma. EXAM: LEFT HAND - COMPLETE 3+ VIEW COMPARISON:  03/05/2019 FINDINGS: Dorsal soft tissue swelling.  Negative for fracture or dislocation. IMPRESSION: Soft tissue swelling without fracture. Electronically Signed   By: Monte Fantasia M.D.   On: 03/30/2019 06:01   Dg Foot Complete Left  Result Date: 03/30/2019 CLINICAL DATA:  Fall with left foot pain. EXAM: LEFT FOOT - COMPLETE 3+ VIEW COMPARISON:  03/05/2019 FINDINGS: Dorsal forefoot soft tissue swelling. No acute fracture or dislocation. No opaque foreign body. IMPRESSION: Soft tissue swelling without fracture. Electronically Signed   By: Monte Fantasia M.D.   On: 03/30/2019 06:02      Procedures   ____________________________________________   INITIAL IMPRESSION / MDM / ASSESSMENT AND PLAN / ED COURSE  As part of my medical decision making, I reviewed the following data within the electronic MEDICAL RECORD NUMBER   23 year old female presented with above-stated history and physical exam secondary to fall.  Patient appears to be intoxicated with benzodiazepine which she admitted to taking 4 tablets of 0.5 mg Xanax tonight.  Review of the patient's narcotic database revealed that she has not received any prescriptions for Xanax.  X-ray of the patient's left hand and  foot reveal no evidence of fracture or dislocation.  Patient will be discharged home into custody of someone who is not intoxicated.  Spoke with the patient at length regarding dangers of benzodiazepine abuse  ____________________________________________  FINAL CLINICAL IMPRESSION(S) / ED DIAGNOSES  Final diagnoses:  Benzodiazepine abuse ([place])  Contusion of left hand, initial encounter  Contusion of left foot, initial encounter     MEDICATIONS GIVEN DURING THIS VISIT:  Medications  ketorolac (TORADOL) tablet 10 mg (10 mg Oral Given 03/30/19 0522)     ED Discharge Orders    None      *Please note:  Carmen Harper was evaluated in Emergency Department on 03/30/2019 for the symptoms described in the history of present illness. She was evaluated in the context of the global COVID-19 pandemic, which necessitated consideration that the patient might be at risk for infection with the SARS-CoV-2 virus that causes COVID-19. Institutional protocols and algorithms that pertain to the evaluation of patients at risk for COVID-19 are in a state of rapid change based on information released by regulatory bodies including the CDC and federal and state organizations. These policies and algorithms were followed during the patient's care in the ED.  Some ED evaluations and interventions may be delayed as a result of limited staffing during the pandemic.*  Note:  This document was prepared using Dragon voice recognition software and may include unintentional dictation errors.   Gregor Hams, MD 03/30/19 [phone]

## 2019-04-07 ENCOUNTER — Other Ambulatory Visit: Payer: Self-pay

## 2019-04-07 ENCOUNTER — Emergency Department: Payer: Medicaid Other

## 2019-04-07 ENCOUNTER — Emergency Department
Admission: EM | Admit: 2019-04-07 | Discharge: 2019-04-07 | Disposition: A | Discharge status: Home / Self Care | Payer: Medicaid Other | Attending: Emergency Medicine | Admitting: Emergency Medicine

## 2019-04-07 DIAGNOSIS — J45909 Unspecified asthma, uncomplicated: Secondary | ICD-10-CM | POA: Insufficient documentation

## 2019-04-07 DIAGNOSIS — Z20828 Contact with and (suspected) exposure to other viral communicable diseases: Secondary | ICD-10-CM | POA: Diagnosis not present

## 2019-04-07 DIAGNOSIS — Z20822 Contact with and (suspected) exposure to covid-19: Secondary | ICD-10-CM

## 2019-04-07 DIAGNOSIS — Z79899 Other long term (current) drug therapy: Secondary | ICD-10-CM | POA: Insufficient documentation

## 2019-04-07 DIAGNOSIS — J029 Acute pharyngitis, unspecified: Secondary | ICD-10-CM | POA: Diagnosis present

## 2019-04-07 HISTORY — DX: Unspecified convulsions: R56.9

## 2019-04-07 LAB — SARS CORONAVIRUS 2 BY RT PCR (HOSPITAL ORDER, PERFORMED IN ~~LOC~~ HOSPITAL LAB): SARS Coronavirus 2: NEGATIVE

## 2019-04-07 NOTE — Discharge Instructions (Addendum)
Use over-the-counter Mucinex.  Tylenol for fever as needed.  Be diligent with your inhaler.  Return emergency department if worsening.  Stay quarantined at home.  You would not be contagious 10 to 14 days after your initial day of symptoms.

## 2019-04-07 NOTE — ED Triage Notes (Signed)
Productive cough, scratchy and sore throat, nasal congestion that started yesterday. Body aches. Family members with same sx. Pt alert and oriented X4, cooperative, RR even and unlabored, color WNL. Pt in NAD.

## 2019-04-07 NOTE — ED Notes (Signed)
See triage note presents with body aches,scratchy throat low grade fever  States her family has had similar sx's

## 2019-04-07 NOTE — ED Notes (Signed)
Provided pt and family with water

## 2019-04-07 NOTE — ED Provider Notes (Signed)
[facility] Emergency Department Provider Note  ____________________________________________   First MD Initiated Contact with Patient 04/07/19 1346     (approximate)  I have reviewed the triage vital signs and the nursing notes.   HISTORY  Chief Complaint Sore Throat, Nasal Congestion, and Cough    HPI Carmen Harper is a 23 y.o. female emergency department with fever, congestion, sore throat, low-grade fever, and cough for 1 day.  Several family members have the same symptoms.  Patient has history of asthma.  States that she did feel like she was having some trouble breathing last night.  When she laid on her abdomen it did help.  No known exposure to covid.   Patient states she also cannot smell.   Past Medical History:  Diagnosis Date  . Anxiety   . Asthma   . Seizures ([place])   . Thyroid disease     Patient Active Problem List   Diagnosis Date Noted  . Anxiety 04/07/2016  . Vaginal delivery 04/06/2016  . Second-degree perineal laceration, with delivery 04/06/2016  . Premature beats 04/05/2016  . Positive GBS test 04/04/2016  . Asthma 04/04/2016    Past Surgical History:  Procedure Laterality Date  . breast tumor       Prior to Admission medications   Medication Sig Start Date End Date Taking? Authorizing Provider  albuterol (VENTOLIN HFA) 108 (90 Base) MCG/ACT inhaler Inhale 1-2 puffs into the lungs every 6 (six) hours as needed for wheezing or shortness of breath.     [provider]  bacitracin ointment Apply 1 application topically 2 (two) times daily. Apply to wounds on leg 04/29/18   Little, Wenda Overland, MD  beclomethasone (QVAR) 80 MCG/ACT inhaler Inhale 2 puffs into the lungs 2 (two) times daily.     [provider]  busPIRone HCl (BUSPAR PO) Take by mouth.    [provider]  cetirizine (ZYRTEC ALLERGY) 10 MG tablet Take 1 tablet (10 mg total) by mouth daily. 03/15/17   Waynetta Pean, PA-C   fluticasone (FLONASE) 50 MCG/ACT nasal spray Place 2 sprays into both nostrils daily. 03/15/17   Waynetta Pean, PA-C  levETIRAcetam (KEPPRA) 500 MG tablet Take 1 tablet (500 mg total) by mouth at bedtime for 7 days. 03/27/19 04/03/19  Duffy Bruce, MD  prochlorperazine (COMPAZINE) 10 MG tablet Take 1 tablet (10 mg total) by mouth 2 (two) times daily as needed for nausea or vomiting. 01/25/17   Tegeler, Gwenyth Allegra, MD    Allergies Sulfa antibiotics  Family History  Problem Relation Age of Onset  . Alcohol abuse Neg Hx   . Arthritis Neg Hx   . Asthma Neg Hx   . Birth defects Neg Hx   . Cancer Neg Hx   . COPD Neg Hx   . Depression Neg Hx   . Diabetes Neg Hx   . Drug abuse Neg Hx   . Early death Neg Hx   . Hearing loss Neg Hx   . Heart disease Neg Hx   . Hyperlipidemia Neg Hx   . Hypertension Neg Hx   . Kidney disease Neg Hx   . Learning disabilities Neg Hx   . Mental illness Neg Hx   . Mental retardation Neg Hx   . Miscarriages / Stillbirths Neg Hx   . Stroke Neg Hx   . Vision loss Neg Hx   . Varicose Veins Neg Hx     Social History Social History   Tobacco Use  .  Smoking status: Never Smoker  . Smokeless tobacco: Never Used  Substance Use Topics  . Alcohol use: No  . Drug use: No    Review of Systems  Constitutional: Positive fever/chills Eyes: No visual changes. ENT: Positive sore throat. Respiratory: Positive cough Genitourinary: Negative for dysuria. Musculoskeletal: Negative for back pain. Skin: Negative for rash.    ____________________________________________   PHYSICAL EXAM:  VITAL SIGNS: ED Triage Vitals  Enc Vitals Group     BP 04/07/19 1330 (!) 139/92     Pulse Rate 04/07/19 1330 (!) 106     Resp 04/07/19 1330 18     Temp 04/07/19 1330 (!) 100.6 F (38.1 C)     Temp Source 04/07/19 1330 Oral     SpO2 04/07/19 1330 97 %     Weight 04/07/19 1331 238 lb 1.6 oz (108 kg)     Height 04/07/19 1331 5\' 5"  (1.651 m)     Head Circumference  --      Peak Flow --      Pain Score 04/07/19 1331 6     Pain Loc --      Pain Edu? --      Excl. in GC? --     Constitutional: Alert and oriented. Well appearing and in no acute distress. Eyes: Conjunctivae are normal.  Head: Atraumatic. Nose: No congestion/rhinnorhea. Mouth/Throat: Mucous membranes are moist.   Neck:  supple no lymphadenopathy noted Cardiovascular: Normal rate, regular rhythm. Heart sounds are normal Respiratory: Normal respiratory effort.  No retractions, lungs c t a  GU: deferred Musculoskeletal: FROM all extremities, warm and well perfused Neurologic:  Normal speech and language.  Skin:  Skin is warm, dry and intact. No rash noted. Psychiatric: Mood and affect are normal. Speech and behavior are normal.  ____________________________________________   LABS (all labs ordered are listed, but only abnormal results are displayed)  Labs Reviewed  SARS CORONAVIRUS 2 (HOSPITAL ORDER, PERFORMED IN [facility])   ____________________________________________   ____________________________________________  RADIOLOGY  Chest x-ray is normal  ____________________________________________   PROCEDURES  Procedure(s) performed: No  Procedures    ____________________________________________   INITIAL IMPRESSION / ASSESSMENT AND PLAN / ED COURSE  Pertinent labs & imaging results that were available during my care of the patient were reviewed by me and considered in my medical decision making (see chart for details).   Patient is 23 year old female presents emergency department COVID-like symptoms.  Physical exam shows patient has elevated temp and heart rate.  Remainder the exam is unremarkable  Chest x-ray is normal COVID-19 test is negative  Explained findings to the patient.  She left prior to the COVID test results.  I did call her to leave a message for the patient to call me.  She was discharged in stable condition with COVID-19  precautions.  She is to use over-the-counter medications.  Return if worsening.    Carmen Harper was evaluated in Emergency Department on 04/07/2019 for the symptoms described in the history of present illness. She was evaluated in the context of the global COVID-19 pandemic, which necessitated consideration that the patient might be at risk for infection with the SARS-CoV-2 virus that causes COVID-19. Institutional protocols and algorithms that pertain to the evaluation of patients at risk for COVID-19 are in a state of rapid change based on information released by regulatory bodies including the CDC and federal and state organizations. These policies and algorithms were followed during the patient's care in the ED.   As  part of my medical decision making, I reviewed the following data within the electronic MEDICAL RECORD NUMBER History obtained from family, Nursing notes reviewed and incorporated, Labs reviewed COVID-19 negative, Old chart reviewed, Radiograph reviewed chest x-ray negative, Notes from prior ED visits and [place] Controlled Substance Database  ____________________________________________   FINAL CLINICAL IMPRESSION(S) / ED DIAGNOSES  Final diagnoses:  Suspected Covid-19 Virus Infection      NEW MEDICATIONS STARTED DURING THIS VISIT:  Discharge Medication List as of 04/07/2019  3:18 PM       Note:  This document was prepared using Dragon voice recognition software and may include unintentional dictation errors.    Faythe Ghee, PA-C 04/07/19 1659    Concha Se, MD 04/08/19 [phone]

## 2019-04-14 ENCOUNTER — Emergency Department: Payer: Medicaid Other

## 2019-04-14 ENCOUNTER — Encounter: Payer: Self-pay | Admitting: Emergency Medicine

## 2019-04-14 ENCOUNTER — Other Ambulatory Visit: Payer: Self-pay

## 2019-04-14 ENCOUNTER — Emergency Department
Admission: EM | Admit: 2019-04-14 | Discharge: 2019-04-14 | Disposition: A | Discharge status: Home / Self Care | Payer: Medicaid Other | Attending: Emergency Medicine | Admitting: Emergency Medicine

## 2019-04-14 DIAGNOSIS — W19XXXA Unspecified fall, initial encounter: Secondary | ICD-10-CM

## 2019-04-14 DIAGNOSIS — Y9301 Activity, walking, marching and hiking: Secondary | ICD-10-CM | POA: Diagnosis not present

## 2019-04-14 DIAGNOSIS — R21 Rash and other nonspecific skin eruption: Secondary | ICD-10-CM | POA: Insufficient documentation

## 2019-04-14 DIAGNOSIS — W108XXA Fall (on) (from) other stairs and steps, initial encounter: Secondary | ICD-10-CM | POA: Insufficient documentation

## 2019-04-14 DIAGNOSIS — L539 Erythematous condition, unspecified: Secondary | ICD-10-CM | POA: Insufficient documentation

## 2019-04-14 DIAGNOSIS — G8929 Other chronic pain: Secondary | ICD-10-CM | POA: Insufficient documentation

## 2019-04-14 DIAGNOSIS — Y92018 Other place in single-family (private) house as the place of occurrence of the external cause: Secondary | ICD-10-CM | POA: Insufficient documentation

## 2019-04-14 DIAGNOSIS — J45909 Unspecified asthma, uncomplicated: Secondary | ICD-10-CM | POA: Insufficient documentation

## 2019-04-14 DIAGNOSIS — Y998 Other external cause status: Secondary | ICD-10-CM | POA: Insufficient documentation

## 2019-04-14 DIAGNOSIS — M545 Low back pain: Secondary | ICD-10-CM | POA: Diagnosis not present

## 2019-04-14 MED ORDER — BACITRACIN-NEOMYCIN-POLYMYXIN 400-5-5000 EX OINT
TOPICAL_OINTMENT | Freq: Once | CUTANEOUS | Status: AC
  Administered 2019-04-14: 2 via TOPICAL
  Filled 2019-04-14: qty 2

## 2019-04-14 NOTE — ED Notes (Addendum)
MD used hemoccult to check red areas on legs for blood. Hemoccult did not show any actual blood on the left leg or arm

## 2019-04-14 NOTE — ED Triage Notes (Addendum)
Patient in via POV, reports falling down stairs, reports hitting head, (+)LOC, complaints of left hand, left foot, and lower back pain.  Pt with excoriations to left forearm and left lower leg, when asked about, patient states, "the flooring is being redone on my stairs, and their are open tac nails everywhere."  A/Ox4, NAD noted at this time.

## 2019-04-14 NOTE — ED Provider Notes (Signed)
[facility] Emergency Department Provider Note  Time seen: 7:42 AM  I have reviewed the triage vital signs and the nursing notes.   HISTORY  Chief Complaint Fall   HPI Carmen Harper is a 23 y.o. female with a past medical history of asthma, anxiety, presents to the emergency department for a fall.  According to the patient last night she fell down her steps.  States they are currently remodeling of the steps did not have carpet but they still have carpet nails and she scraped her left leg and left arm.  Patient has red over her left arm and left leg over the scraped areas.  Denies loss of consciousness to myself although stated positive to triage nurse.  States lower back pain, but states this is chronic.  Denies suicidal or homicidal ideation.  Past Medical History:  Diagnosis Date  . Anxiety   . Asthma   . Seizures (HCC)   . Thyroid disease     Patient Active Problem List   Diagnosis Date Noted  . Anxiety 04/07/2016  . Vaginal delivery 04/06/2016  . Second-degree perineal laceration, with delivery 04/06/2016  . Premature beats 04/05/2016  . Positive GBS test 04/04/2016  . Asthma 04/04/2016    Past Surgical History:  Procedure Laterality Date  . breast tumor       Prior to Admission medications   Medication Sig Start Date End Date Taking? Authorizing Provider  albuterol (VENTOLIN HFA) 108 (90 Base) MCG/ACT inhaler Inhale 1-2 puffs into the lungs every 6 (six) hours as needed for wheezing or shortness of breath.     [provider]  bacitracin ointment Apply 1 application topically 2 (two) times daily. Apply to wounds on leg 04/29/18   Little, Ambrose Finland, MD  beclomethasone (QVAR) 80 MCG/ACT inhaler Inhale 2 puffs into the lungs 2 (two) times daily.     [provider]  busPIRone HCl (BUSPAR PO) Take by mouth.    [provider]  cetirizine (ZYRTEC ALLERGY) 10 MG tablet Take 1 tablet (10 mg total) by mouth daily.  03/15/17   Everlene Farrier, PA-C  fluticasone (FLONASE) 50 MCG/ACT nasal spray Place 2 sprays into both nostrils daily. 03/15/17   Everlene Farrier, PA-C  levETIRAcetam (KEPPRA) 500 MG tablet Take 1 tablet (500 mg total) by mouth at bedtime for 7 days. 03/27/19 04/03/19  Shaune Pollack, MD  prochlorperazine (COMPAZINE) 10 MG tablet Take 1 tablet (10 mg total) by mouth 2 (two) times daily as needed for nausea or vomiting. 01/25/17   Tegeler, Canary Brim, MD    Allergies  Allergen Reactions  . Sulfa Antibiotics Hives    Family History  Problem Relation Age of Onset  . Alcohol abuse Neg Hx   . Arthritis Neg Hx   . Asthma Neg Hx   . Birth defects Neg Hx   . Cancer Neg Hx   . COPD Neg Hx   . Depression Neg Hx   . Diabetes Neg Hx   . Drug abuse Neg Hx   . Early death Neg Hx   . Hearing loss Neg Hx   . Heart disease Neg Hx   . Hyperlipidemia Neg Hx   . Hypertension Neg Hx   . Kidney disease Neg Hx   . Learning disabilities Neg Hx   . Mental illness Neg Hx   . Mental retardation Neg Hx   . Miscarriages / Stillbirths Neg Hx   . Stroke Neg Hx   . Vision loss Neg  Hx   . Varicose Veins Neg Hx     Social History Social History   Tobacco Use  . Smoking status: Never Smoker  . Smokeless tobacco: Never Used  Substance Use Topics  . Alcohol use: No  . Drug use: No    Review of Systems Constitutional: Negative for fever. Cardiovascular: Negative for chest pain. Respiratory: Negative for shortness of breath. Gastrointestinal: Negative for abdominal pain Musculoskeletal: Scrapes to left arm and left leg Skin: Red over left arm and left leg Neurological: Negative for headache All other ROS negative  ____________________________________________   PHYSICAL EXAM:  VITAL SIGNS: ED Triage Vitals [04/14/19 0209]  Enc Vitals Group     BP (!) 125/94     Pulse Rate 89     Resp 15     Temp 97.6 F (36.4 C)     Temp Source Oral     SpO2 100 %     Weight 198 lb 6.6 oz (90 kg)      Height 5\' 5"  (1.651 m)     Head Circumference      Peak Flow      Pain Score 8     Pain Loc      Pain Edu?      Excl. in [place]?    Constitutional: Patient is awake alert oriented, no acute distress. Eyes: Normal exam ENT      Head: Normocephalic and atraumatic      Mouth/Throat: Mucous membranes are moist. Cardiovascular: Normal rate, regular rhythm.  Respiratory: Normal respiratory effort without tachypnea nor retractions. Breath sounds are clear  Gastrointestinal: Soft and nontender. No distention. Musculoskeletal: Patient has scrapes over her left forearm and left lower leg.  There is red over the entire left forearm and left lower leg with larger drips of redness.  Scrapes are all non-gaping and hemostatic.  Mild left foot tenderness. Neurologic:  Normal speech and language. No gross focal neurologic deficits  Skin:  Skin is warm.  Several scrapes to left forearm and left lower leg. Psychiatric: Mood and affect are normal.   ____________________________________________     RADIOLOGY  X-rays are negative  ____________________________________________   INITIAL IMPRESSION / ASSESSMENT AND PLAN / ED COURSE  Pertinent labs & imaging results that were available during my care of the patient were reviewed by me and considered in my medical decision making (see chart for details).   Patient presents to the emergency department after a fall down steps.  Patient states she scraped her left forearm and left leg on carpet nails as they are currently remodeling her stairs.  There is redness over her entire left arm and left leg.  Initially the patient told me it was bleeding and the blood was dripping down the leg.  However the blood appears to be bright red and even on her close there does not appear to be any Browning of the blood as he would expect with time.  I continued to ask the patient about the bleeding.  I used a guaiac card which did not turn blue, and she eventually states that  actually her daughter was playing with red food coloring earlier and that was the cause of the redness.  I asked the patient multiple times if she needed to speak to a psychiatrist, patient refuses.  Denies SI or HI.  Continues to state that the scrapes were caused during a fall, which they may have been.  Scrapes are not located over any major arteries, or in any  pattern, etc. to suggest self-mutilation.  Patient also was complaining of back pain.  No deformities noted.  Patient has rash consistent with erythema ab igne.  However patient denies chronic heating pad use or chronic back pain.  However when I asked about the back and how long it is been hurting she states is been hurting for years.  Patient states her tetanus is up-to-date 2 years ago.  It is unclear why she initially was dishonest about the food coloring or why she attempted to make the injuries appear worse than they were however she continues to deny SI or HI and has refused to speak to psychiatry.  I believe the patient is safe for discharge home.  Carmen Harper was evaluated in Emergency Department on 04/14/2019 for the symptoms described in the history of present illness. She was evaluated in the context of the global COVID-19 pandemic, which necessitated consideration that the patient might be at risk for infection with the SARS-CoV-2 virus that causes COVID-19. Institutional protocols and algorithms that pertain to the evaluation of patients at risk for COVID-19 are in a state of rapid change based on information released by regulatory bodies including the CDC and federal and state organizations. These policies and algorithms were followed during the patient's care in the ED.  ____________________________________________   FINAL CLINICAL IMPRESSION(S) / ED DIAGNOSES  [facility] PallFall Abrasions   Daneli Butkiewicz, MD 04/14/19 [phone]

## 2019-04-14 NOTE — ED Notes (Signed)
Leg and arm cleaned at this time, neosporin placed onto scrapes and wrapped with gauze.

## 2019-04-20 ENCOUNTER — Other Ambulatory Visit: Payer: Self-pay

## 2019-04-20 ENCOUNTER — Encounter (HOSPITAL_COMMUNITY): Payer: Self-pay

## 2019-04-20 ENCOUNTER — Emergency Department (HOSPITAL_COMMUNITY)
Admission: EM | Admit: 2019-04-20 | Discharge: 2019-04-20 | Discharge status: Against Medical Advice | Payer: Medicaid Other | Attending: Emergency Medicine | Admitting: Emergency Medicine

## 2019-04-20 DIAGNOSIS — S0083XA Contusion of other part of head, initial encounter: Secondary | ICD-10-CM | POA: Diagnosis not present

## 2019-04-20 DIAGNOSIS — Y92512 Supermarket, store or market as the place of occurrence of the external cause: Secondary | ICD-10-CM | POA: Diagnosis not present

## 2019-04-20 DIAGNOSIS — Y999 Unspecified external cause status: Secondary | ICD-10-CM | POA: Insufficient documentation

## 2019-04-20 DIAGNOSIS — J45909 Unspecified asthma, uncomplicated: Secondary | ICD-10-CM | POA: Diagnosis not present

## 2019-04-20 DIAGNOSIS — S0990XA Unspecified injury of head, initial encounter: Secondary | ICD-10-CM | POA: Diagnosis present

## 2019-04-20 DIAGNOSIS — Y9389 Activity, other specified: Secondary | ICD-10-CM | POA: Insufficient documentation

## 2019-04-20 DIAGNOSIS — Z79899 Other long term (current) drug therapy: Secondary | ICD-10-CM | POA: Diagnosis not present

## 2019-04-20 MED ORDER — ACETAMINOPHEN 500 MG PO TABS: 1000.0000 mg | ORAL_TABLET | Freq: Once | ORAL | Status: DC
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
## 2019-04-20 NOTE — ED Notes (Signed)
Pt ambulatory. Pt asking where lobby is.

## 2019-04-20 NOTE — ED Notes (Signed)
Pt decided to leave AMA. Stated that she needed to get back to her children.

## 2019-04-24 ENCOUNTER — Other Ambulatory Visit: Payer: Self-pay

## 2019-04-24 ENCOUNTER — Emergency Department
Admission: EM | Admit: 2019-04-24 | Discharge: 2019-04-24 | Disposition: A | Discharge status: Home / Self Care | Payer: Medicaid Other | Attending: Emergency Medicine | Admitting: Emergency Medicine

## 2019-04-24 ENCOUNTER — Encounter: Payer: Self-pay | Admitting: *Deleted

## 2019-04-24 DIAGNOSIS — L03115 Cellulitis of right lower limb: Secondary | ICD-10-CM | POA: Diagnosis not present

## 2019-04-24 DIAGNOSIS — W25XXXA Contact with sharp glass, initial encounter: Secondary | ICD-10-CM | POA: Insufficient documentation

## 2019-04-24 DIAGNOSIS — G40909 Epilepsy, unspecified, not intractable, without status epilepticus: Secondary | ICD-10-CM | POA: Insufficient documentation

## 2019-04-24 DIAGNOSIS — Y9389 Activity, other specified: Secondary | ICD-10-CM | POA: Insufficient documentation

## 2019-04-24 DIAGNOSIS — Y92009 Unspecified place in unspecified non-institutional (private) residence as the place of occurrence of the external cause: Secondary | ICD-10-CM | POA: Diagnosis not present

## 2019-04-24 DIAGNOSIS — J45909 Unspecified asthma, uncomplicated: Secondary | ICD-10-CM | POA: Diagnosis not present

## 2019-04-24 DIAGNOSIS — F419 Anxiety disorder, unspecified: Secondary | ICD-10-CM | POA: Insufficient documentation

## 2019-04-24 DIAGNOSIS — S41112A Laceration without foreign body of left upper arm, initial encounter: Secondary | ICD-10-CM | POA: Insufficient documentation

## 2019-04-24 DIAGNOSIS — Y998 Other external cause status: Secondary | ICD-10-CM | POA: Insufficient documentation

## 2019-04-24 LAB — URINALYSIS, COMPLETE (UACMP) WITH MICROSCOPIC
Bacteria, UA: NONE SEEN
Bilirubin Urine: NEGATIVE
Glucose, UA: NEGATIVE mg/dL
Hgb urine dipstick: NEGATIVE
Ketones, ur: NEGATIVE mg/dL
Nitrite: NEGATIVE
Protein, ur: NEGATIVE mg/dL
Specific Gravity, Urine: 1.003 — ABNORMAL LOW (ref 1.005–1.030)
pH: 7 (ref 5.0–8.0)

## 2019-04-24 LAB — BASIC METABOLIC PANEL
Anion gap: 12 (ref 5–15)
BUN: 9 mg/dL (ref 6–20)
CO2: 23 mmol/L (ref 22–32)
Calcium: 9.3 mg/dL (ref 8.9–10.3)
Chloride: 103 mmol/L (ref 98–111)
Creatinine, Ser: 0.74 mg/dL (ref 0.44–1.00)
GFR calc Af Amer: >60 mL/min (ref >60)
GFR calc non Af Amer: >60 mL/min (ref >60)
Glucose, Bld: 95 mg/dL (ref 70–99)
Potassium: 3.5 mmol/L (ref 3.5–5.1)
Sodium: 138 mmol/L (ref 135–145)

## 2019-04-24 LAB — POCT PREGNANCY, URINE: Preg Test, Ur: NEGATIVE

## 2019-04-24 LAB — CBC WITH DIFFERENTIAL/PLATELET
Abs Immature Granulocytes: 0.02 10*3/uL (ref 0.00–0.07)
Basophils Absolute: 0.1 10*3/uL (ref 0.0–0.1)
Basophils Relative: 1 %
Eosinophils Absolute: 0 10*3/uL (ref 0.0–0.5)
Eosinophils Relative: 1 %
HCT: 36.1 % (ref 36.0–46.0)
Hemoglobin: 11.5 g/dL — ABNORMAL LOW (ref 12.0–15.0)
Immature Granulocytes: 0 %
Lymphocytes Relative: 27 %
Lymphs Abs: 1.8 10*3/uL (ref 0.7–4.0)
MCH: 24.5 pg — ABNORMAL LOW (ref 26.0–34.0)
MCHC: 31.9 g/dL (ref 30.0–36.0)
MCV: 76.8 fL — ABNORMAL LOW (ref 80.0–100.0)
Monocytes Absolute: 0.4 10*3/uL (ref 0.1–1.0)
Monocytes Relative: 7 %
Neutro Abs: 4.3 10*3/uL (ref 1.7–7.7)
Neutrophils Relative %: 64 %
Platelets: 291 10*3/uL (ref 150–400)
RBC: 4.7 MIL/uL (ref 3.87–5.11)
RDW: 13.4 % (ref 11.5–15.5)
WBC: 6.6 10*3/uL (ref 4.0–10.5)
nRBC: 0 % (ref 0.0–0.2)

## 2019-04-24 MED ORDER — DOXYCYCLINE HYCLATE 100 MG PO TABS
100.0000 mg | ORAL_TABLET | Freq: Once | ORAL | Status: AC
  Administered 2019-04-24: 100 mg via ORAL
  Filled 2019-04-24: qty 1

## 2019-04-24 MED ORDER — DOXYCYCLINE HYCLATE 100 MG PO CAPS: 100.0000 mg | ORAL_CAPSULE | Freq: Two times a day (BID) | ORAL | 0 refills | Status: AC

## 2019-04-24 MED ORDER — DOXYCYCLINE HYCLATE 100 MG PO TABS: 100.0000 mg | ORAL_TABLET | Freq: Once | ORAL | Status: DC

## 2019-04-24 MED ORDER — LIDOCAINE-EPINEPHRINE (PF) 2 %-1:200000 IJ SOLN
10.0000 mL | Freq: Once | INTRAMUSCULAR | Status: AC
  Administered 2019-04-24: 10 mL
  Filled 2019-04-24 (×2): qty 10

## 2019-04-24 MED ORDER — LEVETIRACETAM 500 MG PO TABS
500.0000 mg | ORAL_TABLET | Freq: Once | ORAL | Status: AC
  Administered 2019-04-24: 500 mg via ORAL
  Filled 2019-04-24: qty 1
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
## 2019-04-24 NOTE — ED Provider Notes (Addendum)
[facility] Emergency Department Provider Note   ____________________________________________   First MD Initiated Contact with Patient 04/24/19 2112     (approximate)  I have reviewed the triage vital signs and the nursing notes.   HISTORY  Chief Complaint Laceration    HPI Carmen Harper is a 23 y.o. female with past medical history of anxiety, asthma, and seizures presents to the ED complaining of lacerations.  Patient reports that she fell and cut her right leg and left arm on a glass table earlier this evening.  She states she has a history of absence seizure's and does not often remember what has happened.  She is not sure whether she passed out or had a seizure.  She does not believe she hit her head and denies any headache.  She states her tetanus was updated within the past 4 years.  She states she has been out of her seizure medication for the past 3 days, is requesting a refill of this and her anxiety medicine.        Past Medical History:  Diagnosis Date  . Anxiety   . Asthma   . Seizures (HCC)   . Thyroid disease     Patient Active Problem List   Diagnosis Date Noted  . Anxiety 04/07/2016  . Vaginal delivery 04/06/2016  . Second-degree perineal laceration, with delivery 04/06/2016  . Premature beats 04/05/2016  . Positive GBS test 04/04/2016  . Asthma 04/04/2016    Past Surgical History:  Procedure Laterality Date  . breast tumor       Prior to Admission medications   Medication Sig Start Date End Date Taking? Authorizing Provider  albuterol (VENTOLIN HFA) 108 (90 Base) MCG/ACT inhaler Inhale 1-2 puffs into the lungs every 6 (six) hours as needed for wheezing or shortness of breath.     [provider]  bacitracin ointment Apply 1 application topically 2 (two) times daily. Apply to wounds on leg 04/29/18   Little, Ambrose Finlandachel Morgan, MD  beclomethasone (QVAR) 80 MCG/ACT inhaler Inhale 2 puffs into the lungs 2 (two) times  daily.     [provider]  busPIRone HCl (BUSPAR PO) Take by mouth.    [provider]  cetirizine (ZYRTEC ALLERGY) 10 MG tablet Take 1 tablet (10 mg total) by mouth daily. 03/15/17   Everlene Farrieransie, William, PA-C  doxycycline (VIBRAMYCIN) 100 MG capsule Take 1 capsule (100 mg total) by mouth 2 (two) times daily for 7 days. 04/24/19 05/01/19  Chesley NoonJessup, Airlie Blumenberg, MD  fluticasone (FLONASE) 50 MCG/ACT nasal spray Place 2 sprays into both nostrils daily. 03/15/17   Everlene Farrieransie, William, PA-C  levETIRAcetam (KEPPRA) 500 MG tablet Take 1 tablet (500 mg total) by mouth at bedtime for 7 days. 03/27/19 04/03/19  Shaune PollackIsaacs, Cameron, MD  prochlorperazine (COMPAZINE) 10 MG tablet Take 1 tablet (10 mg total) by mouth 2 (two) times daily as needed for nausea or vomiting. 01/25/17   Tegeler, Canary Brimhristopher J, MD    Allergies Sulfa antibiotics  Family History  Problem Relation Age of Onset  . Alcohol abuse Neg Hx   . Arthritis Neg Hx   . Asthma Neg Hx   . Birth defects Neg Hx   . Cancer Neg Hx   . COPD Neg Hx   . Depression Neg Hx   . Diabetes Neg Hx   . Drug abuse Neg Hx   . Early death Neg Hx   . Hearing loss Neg Hx   . Heart disease Neg Hx   .  Hyperlipidemia Neg Hx   . Hypertension Neg Hx   . Kidney disease Neg Hx   . Learning disabilities Neg Hx   . Mental illness Neg Hx   . Mental retardation Neg Hx   . Miscarriages / Stillbirths Neg Hx   . Stroke Neg Hx   . Vision loss Neg Hx   . Varicose Veins Neg Hx     Social History Social History   Tobacco Use  . Smoking status: Never Smoker  . Smokeless tobacco: Never Used  Substance Use Topics  . Alcohol use: No  . Drug use: No    Review of Systems  Constitutional: No fever/chills Eyes: No visual changes. ENT: No sore throat. Cardiovascular: Denies chest pain. Respiratory: Denies shortness of breath. Gastrointestinal: No abdominal pain.  No nausea, no vomiting.  No diarrhea.  No constipation. Genitourinary: Negative for dysuria.  Musculoskeletal: Negative for back pain. Skin: Negative for rash.  Positive for laceration. Neurological: Negative for headaches, focal weakness or numbness.  ____________________________________________   PHYSICAL EXAM:  VITAL SIGNS: ED Triage Vitals  Enc Vitals Group     BP 04/24/19 2005 132/89     Pulse Rate 04/24/19 2005 (!) 101     Resp 04/24/19 2005 18     Temp 04/24/19 2005 98.7 F (37.1 C)     Temp Source 04/24/19 2005 Oral     SpO2 04/24/19 2005 99 %     Weight --      Height --      Head Circumference --      Peak Flow --      Pain Score 04/24/19 2006 0     Pain Loc --      Pain Edu? --      Excl. in [place]? --     Constitutional: Alert and oriented. Eyes: Conjunctivae are normal. Head: Atraumatic. Nose: No congestion/rhinnorhea. Mouth/Throat: Mucous membranes are moist. Neck: Normal ROM Cardiovascular: Normal rate, regular rhythm. Grossly normal heart sounds. Respiratory: Normal respiratory effort.  No retractions. Lungs CTAB. Gastrointestinal: Soft and nontender. No distention. Genitourinary: deferred Musculoskeletal: No lower extremity tenderness nor edema. Neurologic:  Normal speech and language. No gross focal neurologic deficits are appreciated. Skin: Approximately 8 cm laceration to medial portion of right lower leg which appears in the process of healing with some purulent drainage, surrounding erythema and tenderness.  Additional laceration to ventral left forearm appears more recent and superficial, minimal active bleeding. Psychiatric: Mood and affect are normal. Speech and behavior are normal.  ____________________________________________   LABS (all labs ordered are listed, but only abnormal results are displayed)  Labs Reviewed  CBC WITH DIFFERENTIAL/PLATELET - Abnormal; Notable for the following components:      Result Value   Hemoglobin 11.5 (*)    MCV 76.8 (*)    MCH 24.5 (*)    All other components within normal limits  URINALYSIS,  COMPLETE (UACMP) WITH MICROSCOPIC - Abnormal; Notable for the following components:   Color, Urine STRAW (*)    APPearance CLEAR (*)    Specific Gravity, Urine 1.003 (*)    Leukocytes,Ua LARGE (*)    All other components within normal limits  BASIC METABOLIC PANEL  POC URINE PREG, ED  POCT PREGNANCY, URINE   ____________________________________________  EKG  ED ECG REPORT I, Chesley Noonharles Ahlijah Raia, the attending physician, personally viewed and interpreted this ECG.   Date: 04/24/2019  EKG Time: 21:54  Rate: 21:54  Rhythm: normal sinus rhythm  Axis: Normal  Intervals:none  ST&T Change: None  PROCEDURES  Procedure(s) performed (including Critical Care):  Marland KitchenMarland KitchenLaceration Repair  Date/Time: 04/25/2019 12:04 AM Performed by: Chesley Noon, MD Authorized by: Chesley Noon, MD   Consent:    Consent obtained:  Verbal   Consent given by:  Patient Anesthesia (see MAR for exact dosages):    Anesthesia method:  Local infiltration   Local anesthetic:  Lidocaine 2% WITH epi Laceration details:    Location:  Shoulder/arm   Shoulder/arm location:  L lower arm   Length (cm):  2 Repair type:    Repair type:  Simple Pre-procedure details:    Preparation:  Patient was prepped and draped in usual sterile fashion Exploration:    Wound exploration: wound explored through full range of motion     Contaminated: no   Treatment:    Area cleansed with:  Saline   Amount of cleaning:  Standard   Irrigation solution:  Sterile saline   Irrigation method:  Pressure wash   Visualized foreign bodies/material removed: no   Skin repair:    Repair method:  Sutures   Suture size:  5-0   Suture material:  Nylon   Suture technique:  Simple interrupted   Number of sutures:  2 Approximation:    Approximation:  Close Post-procedure details:    Dressing:  Non-adherent dressing   Patient tolerance of procedure:  Tolerated well, no immediate complications      ____________________________________________   INITIAL IMPRESSION / ASSESSMENT AND PLAN / ED COURSE       23 year old female presents to the ED complaining of multiple lacerations after reportedly falling onto a glass table earlier this evening.  Wounds appear clearly to have occurred at different times given stage of healing to right lower extremity wound.  Wound to her right lower extremity appears to be infected but with no evidence of abscess, will require course of antibiotics.  Wound to left forearm appears more recent, superficial but will require 2 stitches.  Wounds appear most consistent with self-inflicted, however patient adamantly denies this do not believe patient is being truthful with much of her history, very unclear whether she may have passed out or had a seizure earlier this evening.  She does not have any signs of head trauma, denies headache, is neurologically intact.  Do not feel imaging of her head is indicated at this time.  Will screen EKG, labs unremarkable, check UA and urine pregnancy.  Patient requesting something for anxiety as well as something for pain, specifically requesting narcotic pain medication.  Laceration to left forearm repaired without issue.  Patient given initial dose of doxycycline for right lower extremity cellulitis, counseled on monitoring for spread of erythema or other concerning symptoms.  EKG without acute findings and patient without any evidence of seizure activity or withdrawal here in the ED.  Urine pregnancy negative and UA unremarkable.  Counseled patient on follow-up with PCP for seizure medications and psychiatry for anxiety medications.  Counseled to follow-up in 1 week for suture removal and wound check.  Patient agrees with plan.      ____________________________________________   FINAL CLINICAL IMPRESSION(S) / ED DIAGNOSES  Final diagnoses:  Laceration of left upper extremity, initial encounter  Cellulitis of right lower extremity      ED Discharge Orders         Ordered    doxycycline (VIBRAMYCIN) 100 MG capsule  2 times daily     04/24/19 2246           Note:  This document was prepared  using Systems analyst and may include unintentional dictation errors.   Blake Divine, MD 04/24/19 2351    Blake Divine, MD 04/25/19 0005

## 2019-05-27 ENCOUNTER — Emergency Department: Payer: Medicaid Other

## 2019-05-27 ENCOUNTER — Encounter: Payer: Self-pay | Admitting: Emergency Medicine

## 2019-05-27 ENCOUNTER — Other Ambulatory Visit: Payer: Self-pay

## 2019-05-27 ENCOUNTER — Emergency Department
Admission: EM | Admit: 2019-05-27 | Discharge: 2019-05-27 | Disposition: A | Discharge status: Home / Self Care | Payer: Medicaid Other | Attending: Emergency Medicine | Admitting: Emergency Medicine

## 2019-05-27 DIAGNOSIS — F13230 Sedative, hypnotic or anxiolytic dependence with withdrawal, uncomplicated: Secondary | ICD-10-CM | POA: Diagnosis not present

## 2019-05-27 DIAGNOSIS — M545 Low back pain, unspecified: Secondary | ICD-10-CM

## 2019-05-27 DIAGNOSIS — Z79899 Other long term (current) drug therapy: Secondary | ICD-10-CM | POA: Insufficient documentation

## 2019-05-27 DIAGNOSIS — J45909 Unspecified asthma, uncomplicated: Secondary | ICD-10-CM | POA: Insufficient documentation

## 2019-05-27 DIAGNOSIS — F13930 Sedative, hypnotic or anxiolytic use, unspecified with withdrawal, uncomplicated: Secondary | ICD-10-CM

## 2019-05-27 LAB — POCT PREGNANCY, URINE: Preg Test, Ur: NEGATIVE

## 2019-05-27 NOTE — ED Triage Notes (Addendum)
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  My mother poured my pills down the toilet, saying if she 'can't have them no one can' it was the one and a half milligram Xanax" -- pt requesting refill.    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  Pt reports sweating/fever and shakes.  Pt reports last xanax was sometime this week.  Speaking in complete coherent sentences. No acute breathing distress noted.  Pt denies further medical hx.

## 2019-05-27 NOTE — ED Notes (Signed)
Per conversation with Dr Ellender Hose: pt is appropriately triaged for being seen on major side in hallway bed.  First RN notified

## 2019-05-27 NOTE — ED Triage Notes (Addendum)
Pt reports pain in the back is from a fall against a dresser earlier.  Back appears without swelling or bruising.  Pt reports the mottling is from epidural admin at second child's birth.    PT NOT APPROPRIATE FOR FLEX

## 2019-05-27 NOTE — ED Provider Notes (Signed)
[facility] Emergency Department Provider Note  Time seen: 11:38 PM  I have reviewed the triage vital signs and the nursing notes.   HISTORY  Chief Complaint Seizures   HPI Carmen Harper is a 23 y.o. female with a past medical history of anxiety, seizure disorder, presents to the emergency department for back pain, states that she has not been able to concentrate and is out of Xanax as well.  According to the patient approximately 10 days ago her mother got upset at her and flushed all of her Xanax down the toilet.  Patient states she has been out of Xanax for the past 10 days has been experiencing intermittent headaches difficulty sleeping, also states she fell several days ago and hit her right back.  Patient's main concern is having Xanax prescription or asking for Xanax at least in the emergency department.  Denies any fever cough or shortness of breath.  States moderate back pain.   Past Medical History:  Diagnosis Date  . Anxiety   . Asthma   . Seizures (HCC)   . Thyroid disease     Patient Active Problem List   Diagnosis Date Noted  . Anxiety 04/07/2016  . Vaginal delivery 04/06/2016  . Second-degree perineal laceration, with delivery 04/06/2016  . Premature beats 04/05/2016  . Positive GBS test 04/04/2016  . Asthma 04/04/2016    Past Surgical History:  Procedure Laterality Date  . breast tumor       Prior to Admission medications   Medication Sig Start Date End Date Taking? Authorizing Provider  albuterol (VENTOLIN HFA) 108 (90 Base) MCG/ACT inhaler Inhale 1-2 puffs into the lungs every 6 (six) hours as needed for wheezing or shortness of breath.     [provider]  bacitracin ointment Apply 1 application topically 2 (two) times daily. Apply to wounds on leg 04/29/18   Little, Ambrose Finland, MD  beclomethasone (QVAR) 80 MCG/ACT inhaler Inhale 2 puffs into the lungs 2 (two) times daily.     [provider]  busPIRone HCl  (BUSPAR PO) Take by mouth.    [provider]  cetirizine (ZYRTEC ALLERGY) 10 MG tablet Take 1 tablet (10 mg total) by mouth daily. 03/15/17   Everlene Farrier, PA-C  fluticasone (FLONASE) 50 MCG/ACT nasal spray Place 2 sprays into both nostrils daily. 03/15/17   Everlene Farrier, PA-C  levETIRAcetam (KEPPRA) 500 MG tablet Take 1 tablet (500 mg total) by mouth at bedtime for 7 days. 03/27/19 04/03/19  Shaune Pollack, MD  prochlorperazine (COMPAZINE) 10 MG tablet Take 1 tablet (10 mg total) by mouth 2 (two) times daily as needed for nausea or vomiting. 01/25/17   Tegeler, Canary Brim, MD    Allergies  Allergen Reactions  . Sulfa Antibiotics Hives    Family History  Problem Relation Age of Onset  . Alcohol abuse Neg Hx   . Arthritis Neg Hx   . Asthma Neg Hx   . Birth defects Neg Hx   . Cancer Neg Hx   . COPD Neg Hx   . Depression Neg Hx   . Diabetes Neg Hx   . Drug abuse Neg Hx   . Early death Neg Hx   . Hearing loss Neg Hx   . Heart disease Neg Hx   . Hyperlipidemia Neg Hx   . Hypertension Neg Hx   . Kidney disease Neg Hx   . Learning disabilities Neg Hx   . Mental illness Neg Hx   . Mental  retardation Neg Hx   . Miscarriages / Stillbirths Neg Hx   . Stroke Neg Hx   . Vision loss Neg Hx   . Varicose Veins Neg Hx     Social History Social History   Tobacco Use  . Smoking status: Never Smoker  . Smokeless tobacco: Never Used  Substance Use Topics  . Alcohol use: No  . Drug use: No    Review of Systems Constitutional: Negative for fever.  Insomnia. Cardiovascular: Negative for chest pain. Respiratory: Negative for shortness of breath. Gastrointestinal: Negative for abdominal pain Musculoskeletal: Moderate back pain. Neurological: Intermittent headaches. All other ROS negative  ____________________________________________   PHYSICAL EXAM:  VITAL SIGNS: ED Triage Vitals  Enc Vitals Group     BP 05/27/19 1913 130/88     Pulse Rate 05/27/19 1913 100      Resp 05/27/19 1913 16     Temp 05/27/19 1913 98.2 F (36.8 C)     Temp Source 05/27/19 1913 Oral     SpO2 05/27/19 1913 100 %     Weight 05/27/19 1913 175 lb (79.4 kg)     Height 05/27/19 1913 5\' 5"  (1.651 m)     Head Circumference --      Peak Flow --      Pain Score 05/27/19 1930 5     Pain Loc --      Pain Edu? --      Excl. in [place]? --    Constitutional: Alert and oriented. Well appearing and in no distress. Eyes: Normal exam ENT      Head: Normocephalic and atraumatic.      Mouth/Throat: Mucous membranes are moist. Cardiovascular: Normal rate, regular rhythm.  Respiratory: Normal respiratory effort without tachypnea nor retractions. Breath sounds are clear  Gastrointestinal: Soft and nontender. No distention.   Musculoskeletal: Nontender with normal range of motion in all extremities Neurologic:  Normal speech and language. No gross focal neurologic deficits Skin:  Skin is warm.  Has rash consistent with heating pad injury to lower back although patient denies. Psychiatric: Mood and affect are normal.   ____________________________________________   INITIAL IMPRESSION / ASSESSMENT AND PLAN / ED COURSE  Pertinent labs & imaging results that were available during my care of the patient were reviewed by me and considered in my medical decision making (see chart for details).   Patient presents emergency department for back pain after a fall also states insomnia headache and having trouble concentrating since stopping Xanax 10 days ago.  Patient is asking for a refill of her Xanax.  I discussed with the patient we cannot refill controlled substances from the emergency department and she needs to follow-up with her prescribing provider.  Patient then asked for Xanax at least in the emergency department.  I again told the patient I would not be giving her Xanax in the emergency department or as a prescription as she has been off Xanax for 10 days she is likely over the worst of the  withdrawal, and I would not want to restart the medication if she has no ability to stay on the medication going forward until she sees her doctor.  Patient states she understands.  She then asked for pain medication for her back.  I offered the patient Tylenol she declined.  I offered to perform lab work to work the patient's complaints of further such as difficulty concentrating and insomnia to check basic labs.  Patient declined and states she thinks she is ready to go  home.  Ulis RiasKatlin G Coatney was evaluated in Emergency Department on 05/27/2019 for the symptoms described in the history of present illness. She was evaluated in the context of the global COVID-19 pandemic, which necessitated consideration that the patient might be at risk for infection with the SARS-CoV-2 virus that causes COVID-19. Institutional protocols and algorithms that pertain to the evaluation of patients at risk for COVID-19 are in a state of rapid change based on information released by regulatory bodies including the CDC and federal and state organizations. These policies and algorithms were followed during the patient's care in the ED.  ____________________________________________   FINAL CLINICAL IMPRESSION(S) / ED DIAGNOSES  Back pain Benzodiazepine withdrawal   Minna AntisPaduchowski, Ziv Welchel, MD 05/27/19 2342

## 2019-06-06 ENCOUNTER — Emergency Department
Admission: EM | Admit: 2019-06-06 | Discharge: 2019-06-06 | Disposition: A | Discharge status: Home / Self Care | Payer: Medicaid Other | Attending: Student | Admitting: Student

## 2019-06-06 DIAGNOSIS — J45909 Unspecified asthma, uncomplicated: Secondary | ICD-10-CM | POA: Diagnosis not present

## 2019-06-06 DIAGNOSIS — Z79899 Other long term (current) drug therapy: Secondary | ICD-10-CM | POA: Insufficient documentation

## 2019-06-06 DIAGNOSIS — R519 Headache, unspecified: Secondary | ICD-10-CM | POA: Diagnosis present

## 2019-06-06 DIAGNOSIS — L739 Follicular disorder, unspecified: Secondary | ICD-10-CM | POA: Diagnosis not present

## 2019-06-06 MED ORDER — MELOXICAM 7.5 MG PO TABS: 7.5000 mg | ORAL_TABLET | Freq: Every day | ORAL | 0 refills | Status: DC

## 2019-06-06 MED ORDER — HYDROCODONE-ACETAMINOPHEN 5-325 MG PO TABS
1.0000 | ORAL_TABLET | Freq: Once | ORAL | Status: AC
  Administered 2019-06-06: 1 via ORAL
  Filled 2019-06-06: qty 1

## 2019-06-06 MED ORDER — CEPHALEXIN 500 MG PO CAPS
500.0000 mg | ORAL_CAPSULE | Freq: Once | ORAL | Status: AC
  Administered 2019-06-06: 21:00:00 500 mg via ORAL
  Filled 2019-06-06: qty 1

## 2019-06-06 MED ORDER — CEPHALEXIN 500 MG PO CAPS: 500.0000 mg | ORAL_CAPSULE | Freq: Four times a day (QID) | ORAL | 0 refills | Status: DC

## 2019-06-06 MED ORDER — ONDANSETRON 4 MG PO TBDP: 4.0000 mg | ORAL_TABLET | Freq: Three times a day (TID) | ORAL | 0 refills | Status: AC | PRN

## 2019-06-06 MED ORDER — ONDANSETRON 8 MG PO TBDP
8.0000 mg | ORAL_TABLET | Freq: Once | ORAL | Status: AC
  Administered 2019-06-06: 21:00:00 8 mg via ORAL
  Filled 2019-06-06: qty 1

## 2019-06-06 NOTE — ED Triage Notes (Addendum)
Patient c/o abscess to posterior head; very tender to palpation. Patient c/o head and neck pain. Patient c/o N/V, with 1 emesis today.

## 2019-06-06 NOTE — ED Provider Notes (Signed)
[facility] Emergency Department Provider Note  ____________________________________________  Time seen: Approximately 8:33 PM  I have reviewed the triage vital signs and the nursing notes.   HISTORY  Chief Complaint Abscess    HPI Carmen Harper is a 23 y.o. female who presents the emergency department complaining of a painful lesion to the scalp.  Patient reports that she has noticed this increasing painful lesion for 5 to 7 days to the posterior scalp.  No purulent drainage from same.  Patient states this area is in the left occipital skull region.  Patient denies any fevers or chills.  No headaches, vision changes, neck pain or stiffness, chest pain, shortness of breath, abdominal pain.  No other complaints at this time.        Past Medical History:  Diagnosis Date  . Anxiety   . Asthma   . Seizures (HCC)   . Thyroid disease     Patient Active Problem List   Diagnosis Date Noted  . Anxiety 04/07/2016  . Vaginal delivery 04/06/2016  . Second-degree perineal laceration, with delivery 04/06/2016  . Premature beats 04/05/2016  . Positive GBS test 04/04/2016  . Asthma 04/04/2016    Past Surgical History:  Procedure Laterality Date  . breast tumor       Prior to Admission medications   Medication Sig Start Date End Date Taking? Authorizing Provider  albuterol (VENTOLIN HFA) 108 (90 Base) MCG/ACT inhaler Inhale 1-2 puffs into the lungs every 6 (six) hours as needed for wheezing or shortness of breath.     [provider]  bacitracin ointment Apply 1 application topically 2 (two) times daily. Apply to wounds on leg 04/29/18   Little, Ambrose Finlandachel Morgan, MD  beclomethasone (QVAR) 80 MCG/ACT inhaler Inhale 2 puffs into the lungs 2 (two) times daily.     [provider]  busPIRone HCl (BUSPAR PO) Take by mouth.    [provider]  cephALEXin (KEFLEX) 500 MG capsule Take 1 capsule (500 mg total) by mouth 4 (four) times daily.  06/06/19   Cuthriell, Delorise RoyalsJonathan D, PA-C  cetirizine (ZYRTEC ALLERGY) 10 MG tablet Take 1 tablet (10 mg total) by mouth daily. 03/15/17   Everlene Farrieransie, William, PA-C  fluticasone (FLONASE) 50 MCG/ACT nasal spray Place 2 sprays into both nostrils daily. 03/15/17   Everlene Farrieransie, William, PA-C  levETIRAcetam (KEPPRA) 500 MG tablet Take 1 tablet (500 mg total) by mouth at bedtime for 7 days. 03/27/19 04/03/19  Shaune PollackIsaacs, Cameron, MD  meloxicam (MOBIC) 7.5 MG tablet Take 1 tablet (7.5 mg total) by mouth daily. 06/06/19 06/05/20  Cuthriell, Delorise RoyalsJonathan D, PA-C  prochlorperazine (COMPAZINE) 10 MG tablet Take 1 tablet (10 mg total) by mouth 2 (two) times daily as needed for nausea or vomiting. 01/25/17   Tegeler, Canary Brimhristopher J, MD    Allergies Sulfa antibiotics  Family History  Problem Relation Age of Onset  . Alcohol abuse Neg Hx   . Arthritis Neg Hx   . Asthma Neg Hx   . Birth defects Neg Hx   . Cancer Neg Hx   . COPD Neg Hx   . Depression Neg Hx   . Diabetes Neg Hx   . Drug abuse Neg Hx   . Early death Neg Hx   . Hearing loss Neg Hx   . Heart disease Neg Hx   . Hyperlipidemia Neg Hx   . Hypertension Neg Hx   . Kidney disease Neg Hx   . Learning disabilities Neg Hx   . Mental  illness Neg Hx   . Mental retardation Neg Hx   . Miscarriages / Stillbirths Neg Hx   . Stroke Neg Hx   . Vision loss Neg Hx   . Varicose Veins Neg Hx     Social History Social History   Tobacco Use  . Smoking status: Never Smoker  . Smokeless tobacco: Never Used  Substance Use Topics  . Alcohol use: No  . Drug use: No     Review of Systems  Constitutional: No fever/chills Eyes: No visual changes. No discharge ENT: No upper respiratory complaints. Cardiovascular: no chest pain. Respiratory: no cough. No SOB. Gastrointestinal: No abdominal pain.  No nausea, no vomiting.  No diarrhea.  No constipation. Musculoskeletal: Negative for musculoskeletal pain. Skin: Positive for painful left occipital scalp lesion Neurological:  Negative for headaches, focal weakness or numbness. 10-point ROS otherwise negative.  ____________________________________________   PHYSICAL EXAM:  VITAL SIGNS: ED Triage Vitals [06/06/19 1903]  Enc Vitals Group     BP (!) 148/84     Pulse Rate (!) 122     Resp 18     Temp 98.1 F (36.7 C)     Temp src      SpO2 97 %     Weight 200 lb (90.7 kg)     Height 5\' 7"  (1.702 m)     Head Circumference      Peak Flow      Pain Score 8     Pain Loc      Pain Edu?      Excl. in GC?      Constitutional: Alert and oriented. Well appearing and in no acute distress. Eyes: Conjunctivae are normal. PERRL. EOMI. Head: Atraumatic.  Visualization of the posterior scalp in the left occipital region reveals erythematous and edematous lesion.  This appears to be folliculitis with no appreciable fluctuance or induration concerning for underlying abscess.  Area measures approximately 2 cm in width.  No other identified lesions to the scalp.  No hair loss. ENT:      Ears:       Nose: No congestion/rhinnorhea.      Mouth/Throat: Mucous membranes are moist.  Neck: No stridor.   Hematological/Lymphatic/Immunilogical: No cervical lymphadenopathy. Cardiovascular: Normal rate, regular rhythm. Normal S1 and S2.  Good peripheral circulation. Respiratory: Normal respiratory effort without tachypnea or retractions. Lungs CTAB. Good air entry to the bases with no decreased or absent breath sounds. Musculoskeletal: Full range of motion to all extremities. No gross deformities appreciated. Neurologic:  Normal speech and language. No gross focal neurologic deficits are appreciated.  Skin:  Skin is warm, dry and intact. No rash noted. Psychiatric: Mood and affect are normal. Speech and behavior are normal. Patient exhibits appropriate insight and judgement.   ____________________________________________   LABS (all labs ordered are listed, but only abnormal results are displayed)  Labs Reviewed - No data  to display ____________________________________________  EKG   ____________________________________________  RADIOLOGY   No results found.  ____________________________________________    PROCEDURES  Procedure(s) performed:    Procedures    Medications  ondansetron (ZOFRAN-ODT) disintegrating tablet 8 mg (has no administration in time range)  HYDROcodone-acetaminophen (NORCO/VICODIN) 5-325 MG per tablet 1 tablet (has no administration in time range)  cephALEXin (KEFLEX) capsule 500 mg (has no administration in time range)     ____________________________________________   INITIAL IMPRESSION / ASSESSMENT AND PLAN / ED COURSE  Pertinent labs & imaging results that were available during my care of the patient were  reviewed by me and considered in my medical decision making (see chart for details).  Review of the [place] CSRS was performed in accordance of the [place] prior to dispensing any controlled drugs.           Patient's diagnosis is consistent with folliculitis of the scalp.  Patient presented to emergency department complaining of a painful lesion to the left occipital skull region.  Findings are consistent with folliculitis.  No appreciable findings concerning for underlying abscess.  No indication for labs or imaging.  No indication for incision and drainage.  Patient is started on antibiotics and emergency department..  Patient will be discharged with Keflex as she is allergic to sulfa and at this time no evidence of MRSA like infection.  Follow-up primary care as needed.  Patient is given ED precautions to return to the ED for any worsening or new symptoms.     ____________________________________________  FINAL CLINICAL IMPRESSION(S) / ED DIAGNOSES  Final diagnoses:  Folliculitis      NEW MEDICATIONS STARTED DURING THIS VISIT:  ED Discharge Orders         Ordered    cephALEXin (KEFLEX) 500 MG capsule  4 times daily     06/06/19 2045    meloxicam  (MOBIC) 7.5 MG tablet  Daily     06/06/19 2045              This chart was dictated using voice recognition software/Dragon. Despite best efforts to proofread, errors can occur which can change the meaning. Any change was purely unintentional.    Darletta Moll, PA-C 06/06/19 2045    Lilia Pro., MD 06/07/19 [phone]

## 2019-06-07 ENCOUNTER — Encounter: Payer: Self-pay | Admitting: Emergency Medicine

## 2019-06-07 ENCOUNTER — Emergency Department
Admission: EM | Admit: 2019-06-07 | Discharge: 2019-06-07 | Disposition: A | Discharge status: Home / Self Care | Payer: Medicaid Other | Attending: Student in an Organized Health Care Education/Training Program | Admitting: Student in an Organized Health Care Education/Training Program

## 2019-06-07 ENCOUNTER — Other Ambulatory Visit: Payer: Self-pay

## 2019-06-07 DIAGNOSIS — L0291 Cutaneous abscess, unspecified: Secondary | ICD-10-CM

## 2019-06-07 DIAGNOSIS — L0211 Cutaneous abscess of neck: Secondary | ICD-10-CM | POA: Insufficient documentation

## 2019-06-07 DIAGNOSIS — J45909 Unspecified asthma, uncomplicated: Secondary | ICD-10-CM | POA: Diagnosis not present

## 2019-06-07 LAB — CBC
HCT: 37 % (ref 36.0–46.0)
Hemoglobin: 11.4 g/dL — ABNORMAL LOW (ref 12.0–15.0)
MCH: 24.6 pg — ABNORMAL LOW (ref 26.0–34.0)
MCHC: 30.8 g/dL (ref 30.0–36.0)
MCV: 79.9 fL — ABNORMAL LOW (ref 80.0–100.0)
Platelets: 297 10*3/uL (ref 150–400)
RBC: 4.63 MIL/uL (ref 3.87–5.11)
RDW: 15.8 % — ABNORMAL HIGH (ref 11.5–15.5)
WBC: 5.7 10*3/uL (ref 4.0–10.5)
nRBC: 0 % (ref 0.0–0.2)

## 2019-06-07 LAB — COMPREHENSIVE METABOLIC PANEL
ALT: 20 U/L (ref 0–44)
AST: 26 U/L (ref 15–41)
Albumin: 4 g/dL (ref 3.5–5.0)
Alkaline Phosphatase: 63 U/L (ref 38–126)
Anion gap: 12 (ref 5–15)
BUN: 11 mg/dL (ref 6–20)
CO2: 25 mmol/L (ref 22–32)
Calcium: 8.6 mg/dL — ABNORMAL LOW (ref 8.9–10.3)
Chloride: 102 mmol/L (ref 98–111)
Creatinine, Ser: 0.77 mg/dL (ref 0.44–1.00)
GFR calc Af Amer: >60 mL/min (ref >60)
GFR calc non Af Amer: >60 mL/min (ref >60)
Glucose, Bld: 69 mg/dL — ABNORMAL LOW (ref 70–99)
Potassium: 3.1 mmol/L — ABNORMAL LOW (ref 3.5–5.1)
Sodium: 139 mmol/L (ref 135–145)
Total Bilirubin: 0.7 mg/dL (ref 0.3–1.2)
Total Protein: 7.4 g/dL (ref 6.5–8.1)

## 2019-06-07 LAB — URINALYSIS, COMPLETE (UACMP) WITH MICROSCOPIC
Bacteria, UA: NONE SEEN
Bilirubin Urine: NEGATIVE
Glucose, UA: NEGATIVE mg/dL
Hgb urine dipstick: NEGATIVE
Ketones, ur: NEGATIVE mg/dL
Nitrite: NEGATIVE
Protein, ur: 30 mg/dL — AB
Specific Gravity, Urine: 1.03 (ref 1.005–1.030)
pH: 5 (ref 5.0–8.0)

## 2019-06-07 LAB — GLUCOSE, CAPILLARY
Glucose-Capillary: 55 mg/dL — ABNORMAL LOW (ref 70–99)
Glucose-Capillary: 73 mg/dL (ref 70–99)
Glucose-Capillary: 82 mg/dL (ref 70–99)

## 2019-06-07 LAB — POCT PREGNANCY, URINE: Preg Test, Ur: NEGATIVE

## 2019-06-07 LAB — LIPASE, BLOOD: Lipase: 14 U/L (ref 11–51)

## 2019-06-07 MED ORDER — CLINDAMYCIN PALMITATE HCL 75 MG/5ML PO SOLR: 300.0000 mg | Freq: Three times a day (TID) | ORAL | 0 refills | Status: AC

## 2019-06-07 MED ORDER — ONDANSETRON 4 MG PO TBDP
ORAL_TABLET | ORAL | Status: AC
  Filled 2019-06-07: qty 1

## 2019-06-07 MED ORDER — CLINDAMYCIN PALMITATE HCL 75 MG/5ML PO SOLR
300.0000 mg | Freq: Once | ORAL | Status: AC
  Administered 2019-06-07: 19:00:00 300 mg via ORAL
  Filled 2019-06-07: qty 20

## 2019-06-07 MED ORDER — HYDROCODONE-ACETAMINOPHEN 5-325 MG PO TABS: 1.0000 | ORAL_TABLET | Freq: Once | ORAL | Status: DC

## 2019-06-07 MED ORDER — HYDROCODONE-ACETAMINOPHEN 7.5-325 MG/15ML PO SOLN: 15.0000 mL | Freq: Four times a day (QID) | ORAL | 0 refills | Status: DC | PRN

## 2019-06-07 MED ORDER — LIDOCAINE HCL (PF) 1 % IJ SOLN
5.0000 mL | Freq: Once | INTRAMUSCULAR | Status: AC
  Administered 2019-06-07: 19:00:00 5 mL via INTRADERMAL
  Filled 2019-06-07: qty 5

## 2019-06-07 MED ORDER — MORPHINE SULFATE (PF) 4 MG/ML IV SOLN
6.0000 mg | INTRAVENOUS | Status: DC | PRN
  Administered 2019-06-07: 18:00:00 6 mg via INTRAMUSCULAR
  Filled 2019-06-07: qty 2

## 2019-06-07 MED ORDER — BACITRACIN ZINC 500 UNIT/GM EX OINT
TOPICAL_OINTMENT | Freq: Once | CUTANEOUS | Status: AC
  Administered 2019-06-07: 19:00:00 via TOPICAL
  Filled 2019-06-07: qty 0.9

## 2019-06-07 MED ORDER — ONDANSETRON 4 MG PO TBDP
4.0000 mg | ORAL_TABLET | Freq: Once | ORAL | Status: AC
  Administered 2019-06-07: 15:00:00 4 mg via ORAL

## 2019-06-07 MED ORDER — HYDROCODONE-ACETAMINOPHEN 7.5-325 MG/15ML PO SOLN
15.0000 mL | Freq: Once | ORAL | Status: AC
  Administered 2019-06-07: 19:00:00 15 mL via ORAL
  Filled 2019-06-07: qty 15

## 2019-06-07 MED ORDER — CLINDAMYCIN HCL 150 MG PO CAPS
300.0000 mg | ORAL_CAPSULE | Freq: Once | ORAL | Status: DC
  Filled 2019-06-07: qty 2

## 2019-06-07 NOTE — ED Notes (Signed)
Pt to ED c/o pain in abscess in back of neck that radiates to left shoulder where she has a knot that she noticed yesterday.  Pt appears in NAD at this time.

## 2019-06-07 NOTE — ED Notes (Signed)
MD at bedside with US.

## 2019-06-07 NOTE — ED Notes (Signed)
Pt signed paper copy of discharge papers °

## 2019-06-07 NOTE — ED Notes (Signed)
Pt provided graham crackers, peanut butter, and juice.  Will recheck CBG.

## 2019-06-07 NOTE — ED Provider Notes (Signed)
[name] Emergency Department Provider Note    First MD Initiated Contact with Patient 06/07/19 1755     (approximate)  I have reviewed the triage vital signs and the nursing notes.   HISTORY  Chief Complaint Abscess    HPI [name] is a 23 y.o. female presents to the ER for evaluation of a painful red lump on the back of her left neck.  Was seen here recently for similar symptoms was treated for early cellulitis not felt to have area amenable to drainage.  The area has grown and worsened.  Patient has been trying to "pop "the area which is caused worsening pain.  Denies any fevers.  No nausea or vomiting.  Did trial a course of Keflex but was not able to tolerate the pill.  States pain is moderate to severe.   Past Medical History:  Diagnosis Date  . Anxiety   . Asthma   . Seizures (HCC)   . Thyroid disease    Family History  Problem Relation Age of Onset  . Alcohol abuse Neg Hx   . Arthritis Neg Hx   . Asthma Neg Hx   . Birth defects Neg Hx   . Cancer Neg Hx   . COPD Neg Hx   . Depression Neg Hx   . Diabetes Neg Hx   . Drug abuse Neg Hx   . Early death Neg Hx   . Hearing loss Neg Hx   . Heart disease Neg Hx   . Hyperlipidemia Neg Hx   . Hypertension Neg Hx   . Kidney disease Neg Hx   . Learning disabilities Neg Hx   . Mental illness Neg Hx   . Mental retardation Neg Hx   . Miscarriages / Stillbirths Neg Hx   . Stroke Neg Hx   . Vision loss Neg Hx   . Varicose Veins Neg Hx    Past Surgical History:  Procedure Laterality Date  . breast tumor      Patient Active Problem List   Diagnosis Date Noted  . Anxiety 04/07/2016  . Vaginal delivery 04/06/2016  . Second-degree perineal laceration, with delivery 04/06/2016  . Premature beats 04/05/2016  . Positive GBS test 04/04/2016  . Asthma 04/04/2016      Prior to Admission medications   Medication Sig Start Date End Date Taking? Authorizing Provider  albuterol  (VENTOLIN HFA) 108 (90 Base) MCG/ACT inhaler Inhale 1-2 puffs into the lungs every 6 (six) hours as needed for wheezing or shortness of breath.     [provider]  bacitracin ointment Apply 1 application topically 2 (two) times daily. Apply to wounds on leg 04/29/18   Little, Ambrose Finland, MD  beclomethasone (QVAR) 80 MCG/ACT inhaler Inhale 2 puffs into the lungs 2 (two) times daily.     [provider]  busPIRone HCl (BUSPAR PO) Take by mouth.    [provider]  cephALEXin (KEFLEX) 500 MG capsule Take 1 capsule (500 mg total) by mouth 4 (four) times daily. 06/06/19   Cuthriell, Delorise Royals, PA-C  cetirizine (ZYRTEC ALLERGY) 10 MG tablet Take 1 tablet (10 mg total) by mouth daily. 03/15/17   Everlene Farrier, PA-C  clindamycin (CLEOCIN) 75 MG/5ML solution Take 20 mLs (300 mg total) by mouth 3 (three) times daily for 7 days. 06/07/19 06/14/19  Willy Eddy, MD  fluticasone (FLONASE) 50 MCG/ACT nasal spray Place 2 sprays into both nostrils daily. 03/15/17   Everlene Farrier, PA-C  HYDROcodone-acetaminophen (HYCET)  7.5-325 mg/15 ml solution Take 15 mLs by mouth every 6 (six) hours as needed for moderate pain. 06/07/19 06/06/20  Merlyn Lot, MD  levETIRAcetam (KEPPRA) 500 MG tablet Take 1 tablet (500 mg total) by mouth at bedtime for 7 days. 03/27/19 04/03/19  Duffy Bruce, MD  meloxicam (MOBIC) 7.5 MG tablet Take 1 tablet (7.5 mg total) by mouth daily. 06/06/19 06/05/20  Cuthriell, Charline Bills, PA-C  ondansetron (ZOFRAN-ODT) 4 MG disintegrating tablet Take 1 tablet (4 mg total) by mouth every 8 (eight) hours as needed for nausea or vomiting. 06/06/19   Cuthriell, Charline Bills, PA-C  prochlorperazine (COMPAZINE) 10 MG tablet Take 1 tablet (10 mg total) by mouth 2 (two) times daily as needed for nausea or vomiting. 01/25/17   Tegeler, Gwenyth Allegra, MD    Allergies Sulfa antibiotics    Social History Social History   Tobacco Use  . Smoking status: Never Smoker  .  Smokeless tobacco: Never Used  Substance Use Topics  . Alcohol use: No  . Drug use: No    Review of Systems Patient denies headaches, rhinorrhea, blurry vision, numbness, shortness of breath, chest pain, edema, cough, abdominal pain, nausea, vomiting, diarrhea, dysuria, fevers, rashes or hallucinations unless otherwise stated above in HPI. ____________________________________________   PHYSICAL EXAM:  VITAL SIGNS: Vitals:   06/07/19 1506  BP: 115/77  Pulse: (!) 122  Resp: 16  Temp: 97.9 F (36.6 C)  SpO2: 100%    Constitutional: Alert and oriented. Well appearing and in no acute distress. Eyes: Conjunctivae are normal.  Head: Atraumatic. Nose: No congestion/rhinnorhea. Mouth/Throat: Mucous membranes are moist.   Neck: Painless ROM.  2 cm fluctuant abscess to left posterior neck with some small area of overlying cellulitis.  No drainage at this time. Cardiovascular:   Good peripheral circulation. Respiratory: Normal respiratory effort.  No retractions.  Gastrointestinal: Soft and nontender.  Musculoskeletal: No lower extremity tenderness .  No joint effusions. Neurologic:  Normal speech and language. No gross focal neurologic deficits are appreciated.  Skin:  Skin is warm, dry and intact. No rash noted. Psychiatric: Mood and affect are normal. Speech and behavior are normal.  ____________________________________________   LABS (all labs ordered are listed, but only abnormal results are displayed)  Results for orders placed or performed during the hospital encounter of 06/07/19 (from the past 24 hour(s))  Lipase, blood     Status: None   Collection Time: 06/07/19  3:15 PM  Result Value Ref Range   Lipase 14 11 - 51 U/L  Comprehensive metabolic panel     Status: Abnormal   Collection Time: 06/07/19  3:15 PM  Result Value Ref Range   Sodium 139 135 - 145 mmol/L   Potassium 3.1 (L) 3.5 - 5.1 mmol/L   Chloride 102 98 - 111 mmol/L   CO2 25 22 - 32 mmol/L   Glucose, Bld  69 (L) 70 - 99 mg/dL   BUN 11 6 - 20 mg/dL   Creatinine, Ser 0.77 0.44 - 1.00 mg/dL   Calcium 8.6 (L) 8.9 - 10.3 mg/dL   Total Protein 7.4 6.5 - 8.1 g/dL   Albumin 4.0 3.5 - 5.0 g/dL   AST 26 15 - 41 U/L   ALT 20 0 - 44 U/L   Alkaline Phosphatase 63 38 - 126 U/L   Total Bilirubin 0.7 0.3 - 1.2 mg/dL   GFR calc non Af Amer >60 >60 mL/min   GFR calc Af Amer >60 >60 mL/min   Anion gap 12 5 -  15  CBC     Status: Abnormal   Collection Time: 06/07/19  3:15 PM  Result Value Ref Range   WBC 5.7 4.0 - 10.5 K/uL   RBC 4.63 3.87 - 5.11 MIL/uL   Hemoglobin 11.4 (L) 12.0 - 15.0 g/dL   HCT 16.1 09.6 - 04.5 %   MCV 79.9 (L) 80.0 - 100.0 fL   MCH 24.6 (L) 26.0 - 34.0 pg   MCHC 30.8 30.0 - 36.0 g/dL   RDW 40.9 (H) 81.1 - 91.4 %   Platelets 297 150 - 400 K/uL   nRBC 0.0 0.0 - 0.2 %  Urinalysis, Complete w Microscopic     Status: Abnormal   Collection Time: 06/07/19  3:15 PM  Result Value Ref Range   Color, Urine YELLOW (A) YELLOW   APPearance CLOUDY (A) CLEAR   Specific Gravity, Urine 1.030 1.005 - 1.030   pH 5.0 5.0 - 8.0   Glucose, UA NEGATIVE NEGATIVE mg/dL   Hgb urine dipstick NEGATIVE NEGATIVE   Bilirubin Urine NEGATIVE NEGATIVE   Ketones, ur NEGATIVE NEGATIVE mg/dL   Protein, ur 30 (A) NEGATIVE mg/dL   Nitrite NEGATIVE NEGATIVE   Leukocytes,Ua MODERATE (A) NEGATIVE   RBC / HPF 6-10 0 - 5 RBC/hpf   WBC, UA 11-20 0 - 5 WBC/hpf   Bacteria, UA NONE SEEN NONE SEEN   Squamous Epithelial / LPF 11-20 0 - 5   Mucus PRESENT   Glucose, capillary     Status: Abnormal   Collection Time: 06/07/19  3:18 PM  Result Value Ref Range   Glucose-Capillary 55 (L) 70 - 99 mg/dL   Comment 1 Notify RN    Comment 2 Document in Chart   Glucose, capillary     Status: None   Collection Time: 06/07/19  3:51 PM  Result Value Ref Range   Glucose-Capillary 73 70 - 99 mg/dL   Comment 1 Notify RN    Comment 2 Document in Chart   Glucose, capillary     Status: None   Collection Time: 06/07/19  5:10 PM   Result Value Ref Range   Glucose-Capillary 82 70 - 99 mg/dL  Pregnancy, urine POC     Status: None   Collection Time: 06/07/19  5:38 PM  Result Value Ref Range   Preg Test, Ur NEGATIVE NEGATIVE   ____________________________________________ ____________________________________________  RADIOLOGY  EMERGENCY DEPARTMENT US SOFT TISSUE INTERPRETATION "Study: Limited Soft Tissue Ultrasound"  INDICATIONS: Soft tissue infection Multiple views of the body part were obtained in real-time with a multi-frequency linear probe  PERFORMED BY: Myself IMAGES ARCHIVED?: No SIDE:Left BODY PART:Neck INTERPRETATION:  Abcess present    ____________________________________________   PROCEDURES  Procedure(s) performed:  Marland KitchenMarland KitchenIncision and Drainage  Date/Time: 06/07/2019 6:51 PM Performed by: Willy Eddy, MD Authorized by: Willy Eddy, MD   Consent:    Consent obtained:  Verbal   Consent given by:  Patient   Risks discussed:  Bleeding, infection, incomplete drainage and pain   Alternatives discussed:  Alternative treatment, delayed treatment and observation Location:    Type:  Abscess   Size:  2   Location:  Neck   Neck location:  L posterior Anesthesia (see MAR for exact dosages):    Anesthesia method:  Local infiltration   Local anesthetic:  Lidocaine 1% w/o epi Procedure type:    Complexity:  Complex Procedure details:    Incision types:  Single straight   Incision depth:  Subcutaneous   Scalpel blade:  11   Wound management:  Probed and deloculated   Drainage:  Purulent   Drainage amount:  Moderate   Wound treatment:  Wound left open   Packing materials:  None Post-procedure details:    Patient tolerance of procedure:  Tolerated well, no immediate complications      Critical Care performed: no ____________________________________________   INITIAL IMPRESSION / ASSESSMENT AND PLAN / ED COURSE  Pertinent labs & imaging results that were available during my  care of the patient were reviewed by me and considered in my medical decision making (see chart for details).  DDX: abscess, cellulitis, FB, folliculitis, carbuncle  Carmen Harper is a [age] y.o. who presents to the ED with left posterior neck abscess as described above.  This was amenable to drainage.  Moderate amount of purulent drainage was expelled after I&D.  Patient tolerated well.  Will transition antibiotic to clindamycin.  Discussed patient will need to return in 24 to 48 hours for wound check.  Patient was able to tolerate PO and was able to ambulate with a steady gait.       ____________________________________________   FINAL CLINICAL IMPRESSION(S) / ED DIAGNOSES  Final diagnoses:  Abscess      NEW MEDICATIONS STARTED DURING THIS VISIT:  New Prescriptions   CLINDAMYCIN (CLEOCIN) 75 MG/5ML SOLUTION    Take 20 mLs (300 mg total) by mouth 3 (three) times daily for 7 days.   HYDROCODONE-ACETAMINOPHEN (HYCET) 7.5-325 MG/15 ML SOLUTION    Take 15 mLs by mouth every 6 (six) hours as needed for moderate pain.     Note:  This document was prepared using Dragon voice recognition software and may include unintentional dictation errors.     Willy Eddyobinson, Rashan Rounsaville, MD 06/07/19 [phone]

## 2019-06-07 NOTE — ED Notes (Signed)
Stephaine,RN made aware of pt CMG of 73.

## 2019-06-07 NOTE — ED Notes (Signed)
CBG 61, [name], RN notified. Pt given apple juice, graham crackers and peanut butter.

## 2019-06-07 NOTE — ED Triage Notes (Addendum)
Pt in via POV, reports abscess to posterior head, tender on palpation, no exudate noted.    Pt also reports generalized weakness, N/V.

## 2019-06-08 ENCOUNTER — Emergency Department: Payer: Medicaid Other

## 2019-06-08 ENCOUNTER — Emergency Department
Admission: EM | Admit: 2019-06-08 | Discharge: 2019-06-08 | Disposition: A | Discharge status: Home / Self Care | Payer: Medicaid Other | Attending: Emergency Medicine | Admitting: Emergency Medicine

## 2019-06-08 ENCOUNTER — Other Ambulatory Visit: Payer: Self-pay

## 2019-06-08 ENCOUNTER — Encounter: Payer: Self-pay | Admitting: *Deleted

## 2019-06-08 DIAGNOSIS — S0093XA Contusion of unspecified part of head, initial encounter: Secondary | ICD-10-CM | POA: Diagnosis not present

## 2019-06-08 DIAGNOSIS — J45909 Unspecified asthma, uncomplicated: Secondary | ICD-10-CM | POA: Diagnosis not present

## 2019-06-08 DIAGNOSIS — Y939 Activity, unspecified: Secondary | ICD-10-CM | POA: Diagnosis not present

## 2019-06-08 DIAGNOSIS — Z79899 Other long term (current) drug therapy: Secondary | ICD-10-CM | POA: Diagnosis not present

## 2019-06-08 DIAGNOSIS — Y929 Unspecified place or not applicable: Secondary | ICD-10-CM | POA: Insufficient documentation

## 2019-06-08 DIAGNOSIS — Y999 Unspecified external cause status: Secondary | ICD-10-CM | POA: Insufficient documentation

## 2019-06-08 DIAGNOSIS — S0990XA Unspecified injury of head, initial encounter: Secondary | ICD-10-CM | POA: Diagnosis present

## 2019-06-08 LAB — CBC WITH DIFFERENTIAL/PLATELET
Abs Immature Granulocytes: 0.01 10*3/uL (ref 0.00–0.07)
Basophils Absolute: 0 10*3/uL (ref 0.0–0.1)
Basophils Relative: 1 %
Eosinophils Absolute: 0.1 10*3/uL (ref 0.0–0.5)
Eosinophils Relative: 2 %
HCT: 34.3 % — ABNORMAL LOW (ref 36.0–46.0)
Hemoglobin: 10.8 g/dL — ABNORMAL LOW (ref 12.0–15.0)
Immature Granulocytes: 0 %
Lymphocytes Relative: 42 %
Lymphs Abs: 2 10*3/uL (ref 0.7–4.0)
MCH: 24.8 pg — ABNORMAL LOW (ref 26.0–34.0)
MCHC: 31.5 g/dL (ref 30.0–36.0)
MCV: 78.7 fL — ABNORMAL LOW (ref 80.0–100.0)
Monocytes Absolute: 0.4 10*3/uL (ref 0.1–1.0)
Monocytes Relative: 8 %
Neutro Abs: 2.2 10*3/uL (ref 1.7–7.7)
Neutrophils Relative %: 47 %
Platelets: 269 10*3/uL (ref 150–400)
RBC: 4.36 MIL/uL (ref 3.87–5.11)
RDW: 15.4 % (ref 11.5–15.5)
WBC: 4.6 10*3/uL (ref 4.0–10.5)
nRBC: 0 % (ref 0.0–0.2)

## 2019-06-08 LAB — COMPREHENSIVE METABOLIC PANEL
ALT: 27 U/L (ref 0–44)
AST: 30 U/L (ref 15–41)
Albumin: 4 g/dL (ref 3.5–5.0)
Alkaline Phosphatase: 69 U/L (ref 38–126)
Anion gap: 10 (ref 5–15)
BUN: 10 mg/dL (ref 6–20)
CO2: 27 mmol/L (ref 22–32)
Calcium: 8.9 mg/dL (ref 8.9–10.3)
Chloride: 100 mmol/L (ref 98–111)
Creatinine, Ser: 0.8 mg/dL (ref 0.44–1.00)
GFR calc Af Amer: >60 mL/min (ref >60)
GFR calc non Af Amer: >60 mL/min (ref >60)
Glucose, Bld: 93 mg/dL (ref 70–99)
Potassium: 3.6 mmol/L (ref 3.5–5.1)
Sodium: 137 mmol/L (ref 135–145)
Total Bilirubin: 0.5 mg/dL (ref 0.3–1.2)
Total Protein: 7 g/dL (ref 6.5–8.1)

## 2019-06-08 LAB — URINALYSIS, COMPLETE (UACMP) WITH MICROSCOPIC
Bacteria, UA: NONE SEEN
Bilirubin Urine: NEGATIVE
Glucose, UA: NEGATIVE mg/dL
Hgb urine dipstick: NEGATIVE
Ketones, ur: NEGATIVE mg/dL
Nitrite: NEGATIVE
Protein, ur: NEGATIVE mg/dL
Specific Gravity, Urine: 1.01 (ref 1.005–1.030)
pH: 5 (ref 5.0–8.0)

## 2019-06-08 LAB — URINE DRUG SCREEN, QUALITATIVE (ARMC ONLY)
Amphetamines, Ur Screen: NOT DETECTED
Barbiturates, Ur Screen: NOT DETECTED
Benzodiazepine, Ur Scrn: POSITIVE — AB
Cannabinoid 50 Ng, Ur ~~LOC~~: POSITIVE — AB
Cocaine Metabolite,Ur ~~LOC~~: NOT DETECTED
MDMA (Ecstasy)Ur Screen: NOT DETECTED
Methadone Scn, Ur: NOT DETECTED
Opiate, Ur Screen: NOT DETECTED
Phencyclidine (PCP) Ur S: NOT DETECTED
Tricyclic, Ur Screen: POSITIVE — AB

## 2019-06-08 LAB — PREGNANCY, URINE: Preg Test, Ur: NEGATIVE

## 2019-06-08 LAB — ETHANOL: Alcohol, Ethyl (B): <10 mg/dL (ref <10)

## 2019-06-08 LAB — SALICYLATE LEVEL: Salicylate Lvl: <7 mg/dL (ref 2.8–30.0)

## 2019-06-08 LAB — ACETAMINOPHEN LEVEL: Acetaminophen (Tylenol), Serum: <10 ug/mL — ABNORMAL LOW (ref 10–30)

## 2019-06-08 MED ORDER — ACETAMINOPHEN 325 MG PO TABS
650.0000 mg | ORAL_TABLET | Freq: Once | ORAL | Status: AC
  Administered 2019-06-08: 650 mg via ORAL
  Filled 2019-06-08: qty 2

## 2019-06-08 NOTE — ED Notes (Signed)
Patient discharged to home per MD order. Patient in stable condition, and deemed medically cleared by ED provider for discharge. Discharge instructions reviewed with patient/family using "Teach Back"; verbalized understanding of medication education and administration, and information about follow-up care. Denies further concerns. ° °

## 2019-06-08 NOTE — ED Triage Notes (Signed)
Pt also c/o R chest pain. Pt is intermittently unresponsive to questions. Pt states she took tylenol and keppra prior to arrival.
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
## 2019-06-26 ENCOUNTER — Other Ambulatory Visit: Payer: Self-pay

## 2019-06-26 ENCOUNTER — Encounter: Payer: Self-pay | Admitting: Emergency Medicine

## 2019-06-26 ENCOUNTER — Emergency Department
Admission: EM | Admit: 2019-06-26 | Discharge: 2019-06-26 | Disposition: A | Discharge status: Home / Self Care | Payer: Medicaid Other | Attending: Emergency Medicine | Admitting: Emergency Medicine

## 2019-06-26 DIAGNOSIS — R110 Nausea: Secondary | ICD-10-CM | POA: Diagnosis not present

## 2019-06-26 DIAGNOSIS — Y92019 Unspecified place in single-family (private) house as the place of occurrence of the external cause: Secondary | ICD-10-CM | POA: Diagnosis not present

## 2019-06-26 DIAGNOSIS — J45909 Unspecified asthma, uncomplicated: Secondary | ICD-10-CM | POA: Diagnosis not present

## 2019-06-26 DIAGNOSIS — W010XXA Fall on same level from slipping, tripping and stumbling without subsequent striking against object, initial encounter: Secondary | ICD-10-CM | POA: Diagnosis not present

## 2019-06-26 DIAGNOSIS — Z79899 Other long term (current) drug therapy: Secondary | ICD-10-CM | POA: Diagnosis not present

## 2019-06-26 DIAGNOSIS — Y998 Other external cause status: Secondary | ICD-10-CM | POA: Diagnosis not present

## 2019-06-26 DIAGNOSIS — S20229A Contusion of unspecified back wall of thorax, initial encounter: Secondary | ICD-10-CM | POA: Diagnosis not present

## 2019-06-26 DIAGNOSIS — S29002A Unspecified injury of muscle and tendon of back wall of thorax, initial encounter: Secondary | ICD-10-CM | POA: Diagnosis present

## 2019-06-26 DIAGNOSIS — Y9301 Activity, walking, marching and hiking: Secondary | ICD-10-CM | POA: Diagnosis not present

## 2019-06-26 LAB — COMPREHENSIVE METABOLIC PANEL
ALT: 27 U/L (ref 0–44)
AST: 28 U/L (ref 15–41)
Albumin: 4.7 g/dL (ref 3.5–5.0)
Alkaline Phosphatase: 79 U/L (ref 38–126)
Anion gap: 13 (ref 5–15)
BUN: 12 mg/dL (ref 6–20)
CO2: 22 mmol/L (ref 22–32)
Calcium: 9.3 mg/dL (ref 8.9–10.3)
Chloride: 104 mmol/L (ref 98–111)
Creatinine, Ser: 0.73 mg/dL (ref 0.44–1.00)
GFR calc Af Amer: >60 mL/min (ref >60)
GFR calc non Af Amer: >60 mL/min (ref >60)
Glucose, Bld: 113 mg/dL — ABNORMAL HIGH (ref 70–99)
Potassium: 3.7 mmol/L (ref 3.5–5.1)
Sodium: 139 mmol/L (ref 135–145)
Total Bilirubin: 0.5 mg/dL (ref 0.3–1.2)
Total Protein: 8.2 g/dL — ABNORMAL HIGH (ref 6.5–8.1)

## 2019-06-26 LAB — URINALYSIS, COMPLETE (UACMP) WITH MICROSCOPIC
Bacteria, UA: NONE SEEN
Bilirubin Urine: NEGATIVE
Glucose, UA: NEGATIVE mg/dL
Ketones, ur: NEGATIVE mg/dL
Nitrite: NEGATIVE
Protein, ur: 30 mg/dL — AB
RBC / HPF: >50 RBC/hpf — ABNORMAL HIGH (ref 0–5)
Specific Gravity, Urine: 1.016 (ref 1.005–1.030)
pH: 5 (ref 5.0–8.0)

## 2019-06-26 LAB — CBC
HCT: 41 % (ref 36.0–46.0)
Hemoglobin: 13.3 g/dL (ref 12.0–15.0)
MCH: 24.9 pg — ABNORMAL LOW (ref 26.0–34.0)
MCHC: 32.4 g/dL (ref 30.0–36.0)
MCV: 76.6 fL — ABNORMAL LOW (ref 80.0–100.0)
Platelets: 364 10*3/uL (ref 150–400)
RBC: 5.35 MIL/uL — ABNORMAL HIGH (ref 3.87–5.11)
RDW: 17.4 % — ABNORMAL HIGH (ref 11.5–15.5)
WBC: 11.4 10*3/uL — ABNORMAL HIGH (ref 4.0–10.5)
nRBC: 0 % (ref 0.0–0.2)

## 2019-06-26 LAB — LIPASE, BLOOD: Lipase: 23 U/L (ref 11–51)

## 2019-06-26 LAB — POCT PREGNANCY, URINE: Preg Test, Ur: NEGATIVE

## 2019-06-26 MED ORDER — MORPHINE SULFATE (PF) 4 MG/ML IV SOLN
4.0000 mg | Freq: Once | INTRAVENOUS | Status: AC
  Administered 2019-06-26: 4 mg via INTRAMUSCULAR
  Filled 2019-06-26: qty 1

## 2019-06-26 MED ORDER — TRAMADOL HCL 50 MG PO TABS: 50.0000 mg | ORAL_TABLET | Freq: Four times a day (QID) | ORAL | 0 refills | Status: AC | PRN

## 2019-06-26 MED ORDER — ONDANSETRON 8 MG PO TBDP
8.0000 mg | ORAL_TABLET | Freq: Once | ORAL | Status: AC
  Administered 2019-06-26: 8 mg via ORAL
  Filled 2019-06-26: qty 1

## 2019-06-26 NOTE — ED Notes (Signed)
staets nausea is better  Pain has eased off some

## 2019-06-26 NOTE — ED Triage Notes (Signed)
Pt reports back pain for 3 days and abdominal pain and vomiting that started last pm.

## 2019-06-26 NOTE — ED Provider Notes (Signed)
[facility] Emergency Department Provider Note   ____________________________________________    I have reviewed the triage vital signs and the nursing notes.   HISTORY  Chief Complaint Back Pain, Emesis, and Abdominal Cramping     HPI Carmen Harper is a 23 y.o. female who presents with complaints of back pain and nausea.  Patient reports she slipped on a Lego 3 days ago and fell flat on her back.  She has had back pain up and down her spine since then.  No neuro deficits.  No incontinence.  She has been taking a lot of ibuprofen and meloxicam, developed some nausea last night.  Denies fevers or chills.  Some abdominal cramping which she attributes to the vomiting.  Is feeling somewhat better now.  Past Medical History:  Diagnosis Date  . Anxiety   . Asthma   . Seizures ([facility])   . Thyroid disease     Patient Active Problem List   Diagnosis Date Noted  . Anxiety 04/07/2016  . Vaginal delivery 04/06/2016  . Second-degree perineal laceration, with delivery 04/06/2016  . Premature beats 04/05/2016  . Positive GBS test 04/04/2016  . Asthma 04/04/2016    Past Surgical History:  Procedure Laterality Date  . breast tumor       Prior to Admission medications   Medication Sig Start Date End Date Taking? Authorizing Provider  albuterol (VENTOLIN HFA) 108 (90 Base) MCG/ACT inhaler Inhale 1-2 puffs into the lungs every 6 (six) hours as needed for wheezing or shortness of breath.     [provider]  bacitracin ointment Apply 1 application topically 2 (two) times daily. Apply to wounds on leg 04/29/18   Little, Ambrose Finlandachel Morgan, MD  beclomethasone (QVAR) 80 MCG/ACT inhaler Inhale 2 puffs into the lungs 2 (two) times daily.     [provider]  busPIRone HCl (BUSPAR PO) Take by mouth.    [provider]  cetirizine (ZYRTEC ALLERGY) 10 MG tablet Take 1 tablet (10 mg total) by mouth daily. 03/15/17   Everlene Farrieransie, William, PA-C   fluticasone (FLONASE) 50 MCG/ACT nasal spray Place 2 sprays into both nostrils daily. 03/15/17   Everlene Farrieransie, William, PA-C  levETIRAcetam (KEPPRA) 500 MG tablet Take 1 tablet (500 mg total) by mouth at bedtime for 7 days. 03/27/19 04/03/19  Shaune PollackIsaacs, Cameron, MD  meloxicam (MOBIC) 7.5 MG tablet Take 1 tablet (7.5 mg total) by mouth daily. 06/06/19 06/05/20  Cuthriell, Delorise RoyalsJonathan D, PA-C  ondansetron (ZOFRAN-ODT) 4 MG disintegrating tablet Take 1 tablet (4 mg total) by mouth every 8 (eight) hours as needed for nausea or vomiting. 06/06/19   Cuthriell, Delorise RoyalsJonathan D, PA-C  prochlorperazine (COMPAZINE) 10 MG tablet Take 1 tablet (10 mg total) by mouth 2 (two) times daily as needed for nausea or vomiting. 01/25/17   Tegeler, Canary Brimhristopher J, MD  traMADol (ULTRAM) 50 MG tablet Take 1 tablet (50 mg total) by mouth every 6 (six) hours as needed. 06/26/19 06/25/20  Jene EveryKinner, Greysen Swanton, MD     Allergies Sulfa antibiotics  Family History  Problem Relation Age of Onset  . Alcohol abuse Neg Hx   . Arthritis Neg Hx   . Asthma Neg Hx   . Birth defects Neg Hx   . Cancer Neg Hx   . COPD Neg Hx   . Depression Neg Hx   . Diabetes Neg Hx   . Drug abuse Neg Hx   . Early death Neg Hx   . Hearing loss Neg Hx   .  Heart disease Neg Hx   . Hyperlipidemia Neg Hx   . Hypertension Neg Hx   . Kidney disease Neg Hx   . Learning disabilities Neg Hx   . Mental illness Neg Hx   . Mental retardation Neg Hx   . Miscarriages / Stillbirths Neg Hx   . Stroke Neg Hx   . Vision loss Neg Hx   . Varicose Veins Neg Hx     Social History Social History   Tobacco Use  . Smoking status: Never Smoker  . Smokeless tobacco: Never Used  Substance Use Topics  . Alcohol use: No  . Drug use: No    Review of Systems  Constitutional: No fever/chills Eyes: No visual changes.  ENT: No neck pain Cardiovascular: Denies chest pain. Respiratory: Denies shortness of breath. Gastrointestinal: As above Genitourinary: Negative for dysuria.  Musculoskeletal: As above Skin: Negative for rash. Neurological: Negative for weakness, no incontinence   ____________________________________________   PHYSICAL EXAM:  VITAL SIGNS: ED Triage Vitals  Enc Vitals Group     BP 06/26/19 0936 128/74     Pulse Rate 06/26/19 0936 (!) 129     Resp --      Temp 06/26/19 0936 97.6 F (36.4 C)     Temp Source 06/26/19 0936 Oral     SpO2 06/26/19 0936 100 %     Weight 06/26/19 0932 86.2 kg (190 lb)     Height 06/26/19 0932 1.702 m (5\' 7" )     Head Circumference --      Peak Flow --      Pain Score 06/26/19 0932 7     Pain Loc --      Pain Edu? --      Excl. in GC? --     Constitutional: Alert and oriented.   Head: Atraumatic. Nose: No congestion/rhinnorhea. Mouth/Throat: Mucous membranes are moist.   Neck:  Painless ROM Cardiovascular: Normal rate, 90 on my exam, regular rhythm.   Good peripheral circulation. Respiratory: Normal respiratory effort.  No retractions. Lungs CTAB. Gastrointestinal: Soft and nontender. No distention.  No CVA tenderness.  Reassuring exam  Musculoskeletal: Back: Mild tenderness along the thoracic and lumbar vertebrae, no point tenderness, no bruising swelling or abnormalities, normal strength in lower extremities, no saddle anesthesia or incontinence Neurologic:  Normal speech and language. No gross focal neurologic deficits are appreciated.  Skin:  Skin is warm, dry and intact. No rash noted. Psychiatric: Mood and affect are normal. Speech and behavior are normal.  ____________________________________________   LABS (all labs ordered are listed, but only abnormal results are displayed)  Labs Reviewed  COMPREHENSIVE METABOLIC PANEL - Abnormal; Notable for the following components:      Result Value   Glucose, Bld 113 (*)    Total Protein 8.2 (*)    All other components within normal limits  CBC - Abnormal; Notable for the following components:   WBC 11.4 (*)    RBC 5.35 (*)    MCV 76.6 (*)     MCH 24.9 (*)    RDW 17.4 (*)    All other components within normal limits  URINALYSIS, COMPLETE (UACMP) WITH MICROSCOPIC - Abnormal; Notable for the following components:   Color, Urine YELLOW (*)    APPearance CLOUDY (*)    Hgb urine dipstick LARGE (*)    Protein, ur 30 (*)    Leukocytes,Ua SMALL (*)    RBC / HPF >50 (*)    Non Squamous Epithelial PRESENT (*)  All other components within normal limits  LIPASE, BLOOD  POC URINE PREG, ED  POCT PREGNANCY, URINE   ____________________________________________  EKG  None ____________________________________________  RADIOLOGY  None ____________________________________________   PROCEDURES  Procedure(s) performed: No  Procedures   Critical Care performed: No ____________________________________________   INITIAL IMPRESSION / ASSESSMENT AND PLAN / ED COURSE  Pertinent labs & imaging results that were available during my care of the patient were reviewed by me and considered in my medical decision making (see chart for details).  Patient presents with complaints of nausea and pain from a fall several days ago.  Exam is overall reassuring, no abdominal tenderness palpation, normal neuro exam.  Suspect pain is related to contusion as low mechanism injury.  Treated with IM morphine, p.o. ODT Zofran with significant improvement.  Continue Zofran, Pepto-Bismol and avoid NSAIDs which may be causing the nausea, return precautions discussed    ____________________________________________   FINAL CLINICAL IMPRESSION(S) / ED DIAGNOSES  Final diagnoses:  Contusion of back, unspecified laterality, initial encounter        Note:  This document was prepared using Dragon voice recognition software and may include unintentional dictation errors.   Lavonia Drafts, MD 06/26/19 1336

## 2019-06-28 ENCOUNTER — Encounter: Payer: Self-pay | Admitting: Emergency Medicine

## 2019-06-28 ENCOUNTER — Other Ambulatory Visit: Payer: Self-pay

## 2019-06-28 DIAGNOSIS — M549 Dorsalgia, unspecified: Secondary | ICD-10-CM | POA: Diagnosis not present

## 2019-06-28 DIAGNOSIS — Z5321 Procedure and treatment not carried out due to patient leaving prior to being seen by health care provider: Secondary | ICD-10-CM | POA: Diagnosis not present

## 2019-06-28 NOTE — ED Triage Notes (Signed)
Patient ambulatory to triage with steady gait, without difficulty or distress noted, mask in place; pt reports seen recent for fall and subsequent back pain and "was told to come back if her back kept hurting"; pt denies any accomp symptoms

## 2019-06-29 ENCOUNTER — Emergency Department
Admission: EM | Admit: 2019-06-29 | Discharge: 2019-06-29 | Disposition: A | Discharge status: Home / Self Care | Payer: Medicaid Other | Attending: Emergency Medicine | Admitting: Emergency Medicine

## 2019-06-29 ENCOUNTER — Emergency Department: Payer: Medicaid Other

## 2019-06-29 DIAGNOSIS — J45909 Unspecified asthma, uncomplicated: Secondary | ICD-10-CM | POA: Diagnosis not present

## 2019-06-29 DIAGNOSIS — N390 Urinary tract infection, site not specified: Secondary | ICD-10-CM

## 2019-06-29 DIAGNOSIS — Z79899 Other long term (current) drug therapy: Secondary | ICD-10-CM | POA: Insufficient documentation

## 2019-06-29 DIAGNOSIS — M545 Low back pain, unspecified: Secondary | ICD-10-CM

## 2019-06-29 DIAGNOSIS — N3000 Acute cystitis without hematuria: Secondary | ICD-10-CM | POA: Insufficient documentation

## 2019-06-29 LAB — URINALYSIS, COMPLETE (UACMP) WITH MICROSCOPIC
Bilirubin Urine: NEGATIVE
Glucose, UA: NEGATIVE mg/dL
Ketones, ur: NEGATIVE mg/dL
Nitrite: NEGATIVE
Protein, ur: NEGATIVE mg/dL
Specific Gravity, Urine: 1.002 — ABNORMAL LOW (ref 1.005–1.030)
pH: 7 (ref 5.0–8.0)

## 2019-06-29 LAB — POCT PREGNANCY, URINE: Preg Test, Ur: NEGATIVE

## 2019-06-29 MED ORDER — OXYCODONE-ACETAMINOPHEN 5-325 MG PO TABS
1.0000 | ORAL_TABLET | Freq: Once | ORAL | Status: AC
  Administered 2019-06-29: 1 via ORAL
  Filled 2019-06-29: qty 1

## 2019-06-29 MED ORDER — CEPHALEXIN 500 MG PO CAPS: 500.0000 mg | ORAL_CAPSULE | Freq: Three times a day (TID) | ORAL | 0 refills | Status: DC

## 2019-06-29 MED ORDER — HYDROCODONE-ACETAMINOPHEN 5-325 MG PO TABS: 1.0000 | ORAL_TABLET | Freq: Four times a day (QID) | ORAL | 0 refills | Status: DC | PRN

## 2019-06-29 MED ORDER — METHOCARBAMOL 500 MG PO TABS
1000.0000 mg | ORAL_TABLET | Freq: Once | ORAL | Status: AC
  Administered 2019-06-29: 1000 mg via ORAL
  Filled 2019-06-29: qty 2

## 2019-06-29 NOTE — Discharge Instructions (Addendum)
Follow-up with your primary care provider and make an appointment to have your urine rechecked after finishing the antibiotic.  You may continue taking your muscle relaxant that was prescribed for you.  Also a prescription for Keflex 500 mg 3 times daily for 10 days was sent to your pharmacy and hydrocodone for pain.  Do not take the pain medication and drive or operate machinery.  You may use ice or heat to your back as needed for discomfort.  Increase fluids.  Return to the emergency department over the holiday weekend if any worsening of your symptoms.

## 2019-06-29 NOTE — ED Triage Notes (Signed)
Pt was seen for a fall earlier in week, prescribed tramadol. Taking without relief. Continues to c/o lower back pain and bilateral leg pain. Pt alert and oriented X4, cooperative, RR even and unlabored, color WNL. Pt in NAD.

## 2019-06-29 NOTE — ED Notes (Signed)
No answer when called several times from lobby 

## 2019-06-29 NOTE — ED Provider Notes (Signed)
[facility] Emergency Department Provider Note  ____________________________________________   First MD Initiated Contact with Patient 06/29/19 [phone]     (approximate)  I have reviewed the triage vital signs and the nursing notes.   HISTORY  Chief Complaint Back Pain and Leg Pain   HPI Carmen Harper is a 23 y.o. female presents to the ED with complaint of low back pain and bilateral leg pain.  Patient states that she fell earlier in the week and was seen in the emergency department at which time she was prescribed tramadol.  She states that there was no x-rays at that time.  She states that this is not helping.  Patient has continued to be ambulatory without any assistance.  There is been no saddle anesthesias or incontinence of bowel or bladder with her back pain.  She rates her pain as 6 out of 10.    Past Medical History:  Diagnosis Date  . Anxiety   . Asthma   . Seizures (HCC)   . Thyroid disease     Patient Active Problem List   Diagnosis Date Noted  . Anxiety 04/07/2016  . Vaginal delivery 04/06/2016  . Second-degree perineal laceration, with delivery 04/06/2016  . Premature beats 04/05/2016  . Positive GBS test 04/04/2016  . Asthma 04/04/2016    Past Surgical History:  Procedure Laterality Date  . breast tumor       Prior to Admission medications   Medication Sig Start Date End Date Taking? Authorizing Provider  albuterol (VENTOLIN HFA) 108 (90 Base) MCG/ACT inhaler Inhale 1-2 puffs into the lungs every 6 (six) hours as needed for wheezing or shortness of breath.     [provider]  bacitracin ointment Apply 1 application topically 2 (two) times daily. Apply to wounds on leg 04/29/18   Little, Ambrose Finlandachel Morgan, MD  beclomethasone (QVAR) 80 MCG/ACT inhaler Inhale 2 puffs into the lungs 2 (two) times daily.     [provider]  busPIRone HCl (BUSPAR PO) Take by mouth.    [provider]  cephALEXin (KEFLEX) 500  MG capsule Take 1 capsule (500 mg total) by mouth 3 (three) times daily. 06/29/19   Tommi RumpsSummers, Shannyn Jankowiak L, PA-C  cetirizine (ZYRTEC ALLERGY) 10 MG tablet Take 1 tablet (10 mg total) by mouth daily. 03/15/17   Everlene Farrieransie, William, PA-C  fluticasone (FLONASE) 50 MCG/ACT nasal spray Place 2 sprays into both nostrils daily. 03/15/17   Everlene Farrieransie, William, PA-C  HYDROcodone-acetaminophen (NORCO/VICODIN) 5-325 MG tablet Take 1 tablet by mouth every 6 (six) hours as needed for moderate pain. 06/29/19   Tommi RumpsSummers, Emersyn Wyss L, PA-C  levETIRAcetam (KEPPRA) 500 MG tablet Take 1 tablet (500 mg total) by mouth at bedtime for 7 days. 03/27/19 04/03/19  Shaune PollackIsaacs, Cameron, MD  meloxicam (MOBIC) 7.5 MG tablet Take 1 tablet (7.5 mg total) by mouth daily. 06/06/19 06/05/20  Cuthriell, Delorise RoyalsJonathan D, PA-C  ondansetron (ZOFRAN-ODT) 4 MG disintegrating tablet Take 1 tablet (4 mg total) by mouth every 8 (eight) hours as needed for nausea or vomiting. 06/06/19   Cuthriell, Delorise RoyalsJonathan D, PA-C  prochlorperazine (COMPAZINE) 10 MG tablet Take 1 tablet (10 mg total) by mouth 2 (two) times daily as needed for nausea or vomiting. 01/25/17   Tegeler, Canary Brimhristopher J, MD  traMADol (ULTRAM) 50 MG tablet Take 1 tablet (50 mg total) by mouth every 6 (six) hours as needed. 06/26/19 06/25/20  Jene EveryKinner, Robert, MD    Allergies Sulfa antibiotics  Family History  Problem Relation Age of Onset  .  Alcohol abuse Neg Hx   . Arthritis Neg Hx   . Asthma Neg Hx   . Birth defects Neg Hx   . Cancer Neg Hx   . COPD Neg Hx   . Depression Neg Hx   . Diabetes Neg Hx   . Drug abuse Neg Hx   . Early death Neg Hx   . Hearing loss Neg Hx   . Heart disease Neg Hx   . Hyperlipidemia Neg Hx   . Hypertension Neg Hx   . Kidney disease Neg Hx   . Learning disabilities Neg Hx   . Mental illness Neg Hx   . Mental retardation Neg Hx   . Miscarriages / Stillbirths Neg Hx   . Stroke Neg Hx   . Vision loss Neg Hx   . Varicose Veins Neg Hx     Social History Social History    Tobacco Use  . Smoking status: Never Smoker  . Smokeless tobacco: Never Used  Substance Use Topics  . Alcohol use: No  . Drug use: No    Review of Systems Constitutional: No fever/chills Cardiovascular: Denies chest pain. Respiratory: Denies shortness of breath. Gastrointestinal: No abdominal pain.  No nausea, no vomiting.  Genitourinary: Negative for dysuria. Musculoskeletal: Positive for low back pain. Skin: Negative for rash. Neurological: Negative for headaches, focal weakness or numbness. ____________________________________________   PHYSICAL EXAM:  VITAL SIGNS: ED Triage Vitals [06/29/19 0900]  Enc Vitals Group     BP 127/78     Pulse Rate (!) 110     Resp 14     Temp 98.7 F (37.1 C)     Temp Source Oral     SpO2 100 %     Weight 198 lb 6.6 oz (90 kg)     Height 5\' 7"  (1.702 m)     Head Circumference      Peak Flow      Pain Score 6     Pain Loc      Pain Edu?      Excl. in [place]?    Constitutional: Alert and oriented. Well appearing and in no acute distress. Eyes: Conjunctivae are normal.  Head: Atraumatic. Nose: No congestion/rhinnorhea. Mouth/Throat: Mucous membranes are moist.  Oropharynx non-erythematous. Neck: No stridor.   Cardiovascular: Normal rate, regular rhythm. Grossly normal heart sounds.  Good peripheral circulation. Respiratory: Normal respiratory effort.  No retractions. Lungs CTAB. Gastrointestinal: Soft and nontender. No distention.  No CVA tenderness. Musculoskeletal: Moves upper and lower extremities without any difficulty.  Patient is ambulatory but with guarded gait.  There is moderate tenderness on palpation of the lower lumbar sacral area.  No obvious bruises or edema is present.  Good muscle strength bilaterally in the lower extremities. Neurologic:  Normal speech and language. No gross focal neurologic deficits are appreciated. No gait instability. Skin:  Skin is warm, dry and intact. No rash noted. Psychiatric: Mood and affect are  normal. Speech and behavior are normal.  ____________________________________________   LABS (all labs ordered are listed, but only abnormal results are displayed)  Labs Reviewed  URINALYSIS, COMPLETE (UACMP) WITH MICROSCOPIC - Abnormal; Notable for the following components:      Result Value   Color, Urine STRAW (*)    APPearance HAZY (*)    Specific Gravity, Urine 1.002 (*)    Hgb urine dipstick MODERATE (*)    Leukocytes,Ua LARGE (*)    Bacteria, UA RARE (*)    Crystals PRESENT (*)    All other  components within normal limits  POC URINE PREG, ED  POCT PREGNANCY, URINE    RADIOLOGY  Official radiology report(s): Dg Lumbar Spine 2-3 Views  Result Date: 06/29/2019 CLINICAL DATA:  Fall. EXAM: LUMBAR SPINE - 2-3 VIEW COMPARISON:  Lumbar radiographs 05/27/2019 FINDINGS: Normal alignment of lumbar vertebral bodies. No loss of vertebral body height or disc height. No pars fracture. No subluxation. IMPRESSION: Normal exam of the lumbar spine. Electronically Signed   By: Genevive Bi M.D.   On: 06/29/2019 10:31    ____________________________________________   PROCEDURES  Procedure(s) performed (including Critical Care):  Procedures  ____________________________________________   INITIAL IMPRESSION / ASSESSMENT AND PLAN / ED COURSE  As part of my medical decision making, I reviewed the following data within the electronic MEDICAL RECORD NUMBER Notes from prior ED visits and [place] Controlled Substance Database  23 year old female presents to the ED with complaint of low back pain.  Patient states she was seen in the ED last week after her fall and reports that there was no images done.  She states that the tramadol is not helping with her pain.  Physical exam showed moderate muscle skeletal tenderness.  Good muscle strength bilaterally in the lower extremities.  Patient is ambulatory with slow guarded gait.  X-rays of the lumbar spine were negative.  Urinalysis however showed a UTI  with large leukocytes and 21-50 WBCs..  Patient is to continue taking the medicine previously given to her.  She was also given a prescription for Norco as needed for pain every 6 hours and Keflex 500 mg 3 times daily for 10 days.  She is encouraged to increase fluids.  She is aware that she can use ice or heat to her back as needed for discomfort. ____________________________________________   FINAL CLINICAL IMPRESSION(S) / ED DIAGNOSES  Final diagnoses:  Acute urinary tract infection  Acute low back pain without sciatica, unspecified back pain laterality     ED Discharge Orders         Ordered    HYDROcodone-acetaminophen (NORCO/VICODIN) 5-325 MG tablet  Every 6 hours PRN     06/29/19 1112    cephALEXin (KEFLEX) 500 MG capsule  3 times daily     06/29/19 1112           Note:  This document was prepared using Dragon voice recognition software and may include unintentional dictation errors.    Tommi Rumps, PA-C 06/29/19 1616    Minna Antis, MD 06/30/19 2325

## 2019-07-02 ENCOUNTER — Other Ambulatory Visit: Payer: Self-pay

## 2019-07-02 ENCOUNTER — Encounter: Payer: Self-pay | Admitting: Emergency Medicine

## 2019-07-02 ENCOUNTER — Emergency Department: Payer: Medicaid Other

## 2019-07-02 ENCOUNTER — Emergency Department
Admission: EM | Admit: 2019-07-02 | Discharge: 2019-07-02 | Disposition: A | Discharge status: Home / Self Care | Payer: Medicaid Other | Attending: Emergency Medicine | Admitting: Emergency Medicine

## 2019-07-02 DIAGNOSIS — M25532 Pain in left wrist: Secondary | ICD-10-CM | POA: Diagnosis not present

## 2019-07-02 DIAGNOSIS — Y999 Unspecified external cause status: Secondary | ICD-10-CM | POA: Insufficient documentation

## 2019-07-02 DIAGNOSIS — J45909 Unspecified asthma, uncomplicated: Secondary | ICD-10-CM | POA: Insufficient documentation

## 2019-07-02 DIAGNOSIS — Y929 Unspecified place or not applicable: Secondary | ICD-10-CM | POA: Insufficient documentation

## 2019-07-02 DIAGNOSIS — W010XXA Fall on same level from slipping, tripping and stumbling without subsequent striking against object, initial encounter: Secondary | ICD-10-CM | POA: Insufficient documentation

## 2019-07-02 DIAGNOSIS — Y9301 Activity, walking, marching and hiking: Secondary | ICD-10-CM | POA: Insufficient documentation

## 2019-07-02 MED ORDER — HYDROCODONE-ACETAMINOPHEN 5-325 MG PO TABS
1.0000 | ORAL_TABLET | Freq: Once | ORAL | Status: AC
  Administered 2019-07-02: 1 via ORAL
  Filled 2019-07-02: qty 1

## 2019-07-02 NOTE — ED Notes (Signed)
Pt states she is accident prone. Pt states pain is 8/10. Splint applied to left arm

## 2019-07-02 NOTE — ED Provider Notes (Signed)
[facility] Emergency Department Provider Note ____________________________________________  Time seen: Approximately 8:29 PM  I have reviewed the triage vital signs and the nursing notes.   HISTORY  Chief Complaint Fall, Wrist Pain, and Hand Pain    HPI Carmen Harper is a 23 y.o. female who presents to the emergency department for evaluation and treatment of left hand, wrist, and forearm pain after a mechanical, nonsyncopal fall.  Patient states that she slipped on something on the floor and reached out to catch herself and landed with her left arm outstretched.  Pain since the injury occurred about 6 PM.  No relief with Tylenol and ibuprofen.  Past Medical History:  Diagnosis Date  . Anxiety   . Asthma   . Seizures (HCC)   . Thyroid disease     Patient Active Problem List   Diagnosis Date Noted  . Anxiety 04/07/2016  . Vaginal delivery 04/06/2016  . Second-degree perineal laceration, with delivery 04/06/2016  . Premature beats 04/05/2016  . Positive GBS test 04/04/2016  . Asthma 04/04/2016    Past Surgical History:  Procedure Laterality Date  . breast tumor       Prior to Admission medications   Medication Sig Start Date End Date Taking? Authorizing Provider  albuterol (VENTOLIN HFA) 108 (90 Base) MCG/ACT inhaler Inhale 1-2 puffs into the lungs every 6 (six) hours as needed for wheezing or shortness of breath.     [provider]  bacitracin ointment Apply 1 application topically 2 (two) times daily. Apply to wounds on leg 04/29/18   Little, Ambrose Finland, MD  beclomethasone (QVAR) 80 MCG/ACT inhaler Inhale 2 puffs into the lungs 2 (two) times daily.     [provider]  busPIRone HCl (BUSPAR PO) Take by mouth.    [provider]  cephALEXin (KEFLEX) 500 MG capsule Take 1 capsule (500 mg total) by mouth 3 (three) times daily. 06/29/19   Tommi Rumps, PA-C  cetirizine (ZYRTEC ALLERGY) 10 MG tablet Take 1 tablet  (10 mg total) by mouth daily. 03/15/17   Everlene Farrier, PA-C  fluticasone (FLONASE) 50 MCG/ACT nasal spray Place 2 sprays into both nostrils daily. 03/15/17   Everlene Farrier, PA-C  HYDROcodone-acetaminophen (NORCO/VICODIN) 5-325 MG tablet Take 1 tablet by mouth every 6 (six) hours as needed for moderate pain. 06/29/19   Tommi Rumps, PA-C  levETIRAcetam (KEPPRA) 500 MG tablet Take 1 tablet (500 mg total) by mouth at bedtime for 7 days. 03/27/19 04/03/19  Shaune Pollack, MD  meloxicam (MOBIC) 7.5 MG tablet Take 1 tablet (7.5 mg total) by mouth daily. 06/06/19 06/05/20  Cuthriell, Delorise Royals, PA-C  ondansetron (ZOFRAN-ODT) 4 MG disintegrating tablet Take 1 tablet (4 mg total) by mouth every 8 (eight) hours as needed for nausea or vomiting. 06/06/19   Cuthriell, Delorise Royals, PA-C  prochlorperazine (COMPAZINE) 10 MG tablet Take 1 tablet (10 mg total) by mouth 2 (two) times daily as needed for nausea or vomiting. 01/25/17   Tegeler, Canary Brim, MD  traMADol (ULTRAM) 50 MG tablet Take 1 tablet (50 mg total) by mouth every 6 (six) hours as needed. 06/26/19 06/25/20  Jene Every, MD    Allergies Sulfa antibiotics  Family History  Problem Relation Age of Onset  . Alcohol abuse Neg Hx   . Arthritis Neg Hx   . Asthma Neg Hx   . Birth defects Neg Hx   . Cancer Neg Hx   . COPD Neg Hx   . Depression Neg Hx   .  Diabetes Neg Hx   . Drug abuse Neg Hx   . Early death Neg Hx   . Hearing loss Neg Hx   . Heart disease Neg Hx   . Hyperlipidemia Neg Hx   . Hypertension Neg Hx   . Kidney disease Neg Hx   . Learning disabilities Neg Hx   . Mental illness Neg Hx   . Mental retardation Neg Hx   . Miscarriages / Stillbirths Neg Hx   . Stroke Neg Hx   . Vision loss Neg Hx   . Varicose Veins Neg Hx     Social History Social History   Tobacco Use  . Smoking status: Never Smoker  . Smokeless tobacco: Never Used  Substance Use Topics  . Alcohol use: No  . Drug use: No    Review of  Systems Constitutional: Negative for fever. Cardiovascular: Negative for chest pain. Respiratory: Negative for shortness of breath. Musculoskeletal: Positive for left hand, wrist, and arm pain. Skin: Positive for bruising to the left hand and wrist Neurological: Negative for decrease in sensation  ____________________________________________   PHYSICAL EXAM:  VITAL SIGNS: ED Triage Vitals  Enc Vitals Group     BP 07/02/19 1854 136/81     Pulse Rate 07/02/19 1854 (!) 110     Resp 07/02/19 1854 18     Temp 07/02/19 1854 98.1 F (36.7 C)     Temp Source 07/02/19 1854 Oral     SpO2 07/02/19 1854 100 %     Weight 07/02/19 1855 200 lb (90.7 kg)     Height 07/02/19 1855 5\' 7"  (1.702 m)     Head Circumference --      Peak Flow --      Pain Score 07/02/19 1855 9     Pain Loc --      Pain Edu? --      Excl. in [place]? --     Constitutional: Alert and oriented. Well appearing and in no acute distress. Eyes: Conjunctivae are clear without discharge or drainage Head: Atraumatic Neck: Supple.  No focal midline tenderness. Respiratory: No cough. Respirations are even and unlabored. Musculoskeletal: Resistant to attempt active or passive range of motion of the left hand, wrist, or elbow. Neurologic: Motor and sensory function of the left hand is intact Skin: Contusion over the left hand and wrist does not appear new.  Psychiatric: Affect and behavior are appropriate.  ____________________________________________   LABS (all labs ordered are listed, but only abnormal results are displayed)  Labs Reviewed - No data to display ____________________________________________  RADIOLOGY  Images of the left hand, wrist, and forearm are all negative for acute bony abnormality per radiology. ____________________________________________   PROCEDURES  Procedures  ____________________________________________   INITIAL IMPRESSION / ASSESSMENT AND PLAN / ED COURSE  Carmen Harper is a  23 y.o. who presents to the emergency department for treatment and evaluation of left hand and wrist injury after mechanical, nonsyncopal fall tonight.  See HPI for further details.  X-rays are all normal.  There are no acute fractures.  Bruising of the left wrist and hand does not appear to have occurred this evening.  According to the controlled substance database, the patient received 90 hydrocodone 7.5/325 on June 07, 2019.  The patient denies this.  Tonight, she will be given a single hydrocodone while here and her left arm wrapped in an Ace bandage.  She is to schedule follow-up appointment with her primary care provider or orthopedics if her pain does  not resolve over the week.  Medications  HYDROcodone-acetaminophen (NORCO/VICODIN) 5-325 MG per tablet 1 tablet (1 tablet Oral Given 07/02/19 2000)    Pertinent labs & imaging results that were available during my care of the patient were reviewed by me and considered in my medical decision making (see chart for details).  _________________________________________   FINAL CLINICAL IMPRESSION(S) / ED DIAGNOSES  Final diagnoses:  Left wrist pain    ED Discharge Orders    None       If controlled substance prescribed during this visit, 12 month history viewed on the [facility] prior to issuing an initial prescription for Schedule II or III opiod.   Chinita Pesterriplett, Yerania Chamorro B, FNP 07/02/19 2038    Jene EveryKinner, Robert, MD 07/02/19 2215

## 2019-07-02 NOTE — ED Notes (Signed)
Pt states her husband is coming to get her.

## 2019-07-02 NOTE — ED Triage Notes (Signed)
Pt arrived via POV with reports of fall around 6pm. Pt states she slipped on something on the floor and tried to catch herself with her left arm.   Pt has bruising and swelling noted to left hand and forearm.

## 2019-07-05 ENCOUNTER — Emergency Department
Admission: EM | Admit: 2019-07-05 | Discharge: 2019-07-06 | Disposition: A | Discharge status: Home / Self Care | Payer: Medicaid Other | Attending: Student | Admitting: Student

## 2019-07-05 ENCOUNTER — Other Ambulatory Visit: Payer: Self-pay

## 2019-07-05 ENCOUNTER — Encounter: Payer: Self-pay | Admitting: Emergency Medicine

## 2019-07-05 ENCOUNTER — Emergency Department: Payer: Medicaid Other

## 2019-07-05 DIAGNOSIS — Y999 Unspecified external cause status: Secondary | ICD-10-CM | POA: Diagnosis not present

## 2019-07-05 DIAGNOSIS — Y939 Activity, unspecified: Secondary | ICD-10-CM | POA: Diagnosis not present

## 2019-07-05 DIAGNOSIS — J45909 Unspecified asthma, uncomplicated: Secondary | ICD-10-CM | POA: Diagnosis not present

## 2019-07-05 DIAGNOSIS — S93491A Sprain of other ligament of right ankle, initial encounter: Secondary | ICD-10-CM | POA: Insufficient documentation

## 2019-07-05 DIAGNOSIS — W109XXA Fall (on) (from) unspecified stairs and steps, initial encounter: Secondary | ICD-10-CM | POA: Diagnosis not present

## 2019-07-05 DIAGNOSIS — Z79899 Other long term (current) drug therapy: Secondary | ICD-10-CM | POA: Insufficient documentation

## 2019-07-05 DIAGNOSIS — S20229A Contusion of unspecified back wall of thorax, initial encounter: Secondary | ICD-10-CM | POA: Insufficient documentation

## 2019-07-05 DIAGNOSIS — S3992XA Unspecified injury of lower back, initial encounter: Secondary | ICD-10-CM | POA: Diagnosis present

## 2019-07-05 DIAGNOSIS — Y929 Unspecified place or not applicable: Secondary | ICD-10-CM | POA: Insufficient documentation

## 2019-07-05 MED ORDER — HYDROCODONE-ACETAMINOPHEN 5-325 MG PO TABS: 1.0000 | ORAL_TABLET | ORAL | 0 refills | Status: AC | PRN

## 2019-07-05 MED ORDER — HYDROCODONE-ACETAMINOPHEN 5-325 MG PO TABS
1.0000 | ORAL_TABLET | ORAL | Status: AC
  Administered 2019-07-05: 1 via ORAL
  Filled 2019-07-05: qty 1

## 2019-07-05 NOTE — Discharge Instructions (Addendum)
Please apply ice to the lower back 20 minutes every hour.  Rest ice and elevate the right lower extremity.  Use ankle splint as needed for comfort with ambulation.  Take pain medication as prescribed return to the ER for any worsening symptoms or urgent changes in health.
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
## 2019-07-10 ENCOUNTER — Other Ambulatory Visit: Payer: Self-pay

## 2019-07-10 ENCOUNTER — Emergency Department
Admission: EM | Admit: 2019-07-10 | Discharge: 2019-07-10 | Disposition: A | Discharge status: Home / Self Care | Payer: Medicaid Other | Attending: Emergency Medicine | Admitting: Emergency Medicine

## 2019-07-10 DIAGNOSIS — M545 Low back pain: Secondary | ICD-10-CM | POA: Diagnosis not present

## 2019-07-10 DIAGNOSIS — Y929 Unspecified place or not applicable: Secondary | ICD-10-CM | POA: Insufficient documentation

## 2019-07-10 DIAGNOSIS — Y998 Other external cause status: Secondary | ICD-10-CM | POA: Diagnosis not present

## 2019-07-10 DIAGNOSIS — Z79899 Other long term (current) drug therapy: Secondary | ICD-10-CM | POA: Diagnosis not present

## 2019-07-10 DIAGNOSIS — W228XXA Striking against or struck by other objects, initial encounter: Secondary | ICD-10-CM | POA: Insufficient documentation

## 2019-07-10 DIAGNOSIS — J45909 Unspecified asthma, uncomplicated: Secondary | ICD-10-CM | POA: Insufficient documentation

## 2019-07-10 DIAGNOSIS — Y9389 Activity, other specified: Secondary | ICD-10-CM | POA: Diagnosis not present

## 2019-07-10 DIAGNOSIS — S8992XA Unspecified injury of left lower leg, initial encounter: Secondary | ICD-10-CM | POA: Diagnosis present

## 2019-07-10 DIAGNOSIS — S81812A Laceration without foreign body, left lower leg, initial encounter: Secondary | ICD-10-CM | POA: Diagnosis not present

## 2019-07-10 MED ORDER — LIDOCAINE HCL (PF) 1 % IJ SOLN
5.0000 mL | Freq: Once | INTRAMUSCULAR | Status: AC
  Administered 2019-07-10: 17:00:00 5 mL

## 2019-07-10 MED ORDER — OXYCODONE-ACETAMINOPHEN 7.5-325 MG PO TABS: 1.0000 | ORAL_TABLET | Freq: Four times a day (QID) | ORAL | 0 refills | Status: AC | PRN

## 2019-07-10 MED ORDER — LIDOCAINE HCL (PF) 1 % IJ SOLN
INTRAMUSCULAR | Status: AC
  Administered 2019-07-10: 5 mL
  Filled 2019-07-10: qty 5

## 2019-07-10 MED ORDER — BACITRACIN-NEOMYCIN-POLYMYXIN 400-5-5000 EX OINT
TOPICAL_OINTMENT | Freq: Once | CUTANEOUS | Status: AC
  Administered 2019-07-10: 1 via TOPICAL
  Filled 2019-07-10: qty 1

## 2019-07-10 MED ORDER — LIDOCAINE HCL (PF) 1 % IJ SOLN
INTRAMUSCULAR | Status: AC
  Administered 2019-07-10: 17:00:00 5 mL
  Filled 2019-07-10: qty 5

## 2019-07-10 NOTE — ED Provider Notes (Signed)
[facility] Emergency Department Provider Note   ____________________________________________   First MD Initiated Contact with Patient 07/10/19 1608     (approximate)  I have reviewed the triage vital signs and the nursing notes.   HISTORY  Chief Complaint Laceration and Back Pain    HPI Carmen Harper is a 23 y.o. female patient presents with a laceration to the left lower leg.  Patient state x-ray of rolled over her leg and onto her back.  Patient also complained of back pain secondary incident.  Patient states she took a leftover oxycodone prior to arrival.  Patient appears drowsy which could be a side effect of the medications.  Patient rates the pain as 8/10.  Patient described pain is "achy".         Past Medical History:  Diagnosis Date  . Anxiety   . Asthma   . Seizures ([facility])   . Thyroid disease     Patient Active Problem List   Diagnosis Date Noted  . Anxiety 04/07/2016  . Vaginal delivery 04/06/2016  . Second-degree perineal laceration, with delivery 04/06/2016  . Premature beats 04/05/2016  . Positive GBS test 04/04/2016  . Asthma 04/04/2016    Past Surgical History:  Procedure Laterality Date  . breast tumor       Prior to Admission medications   Medication Sig Start Date End Date Taking? Authorizing Provider  albuterol (VENTOLIN HFA) 108 (90 Base) MCG/ACT inhaler Inhale 1-2 puffs into the lungs every 6 (six) hours as needed for wheezing or shortness of breath.     [provider]  bacitracin ointment Apply 1 application topically 2 (two) times daily. Apply to wounds on leg 04/29/18   Little, Ambrose Finlandachel Morgan, MD  beclomethasone (QVAR) 80 MCG/ACT inhaler Inhale 2 puffs into the lungs 2 (two) times daily.     [provider]  busPIRone HCl (BUSPAR PO) Take by mouth.    [provider]  cetirizine (ZYRTEC ALLERGY) 10 MG tablet Take 1 tablet (10 mg total) by mouth daily. 03/15/17   Everlene Farrieransie, William, PA-C   fluticasone (FLONASE) 50 MCG/ACT nasal spray Place 2 sprays into both nostrils daily. 03/15/17   Everlene Farrieransie, William, PA-C  HYDROcodone-acetaminophen (NORCO) 5-325 MG tablet Take 1 tablet by mouth every 4 (four) hours as needed for moderate pain. 07/05/19   Evon SlackGaines, Thomas C, PA-C  levETIRAcetam (KEPPRA) 500 MG tablet Take 1 tablet (500 mg total) by mouth at bedtime for 7 days. 03/27/19 04/03/19  Shaune PollackIsaacs, Cameron, MD  meloxicam (MOBIC) 7.5 MG tablet Take 1 tablet (7.5 mg total) by mouth daily. 06/06/19 06/05/20  Cuthriell, Delorise RoyalsJonathan D, PA-C  ondansetron (ZOFRAN-ODT) 4 MG disintegrating tablet Take 1 tablet (4 mg total) by mouth every 8 (eight) hours as needed for nausea or vomiting. 06/06/19   Cuthriell, Delorise RoyalsJonathan D, PA-C  oxyCODONE-acetaminophen (PERCOCET) 7.5-325 MG tablet Take 1 tablet by mouth every 6 (six) hours as needed for up to 3 days. 07/10/19 07/13/19  Joni ReiningSmith,  K, PA-C  prochlorperazine (COMPAZINE) 10 MG tablet Take 1 tablet (10 mg total) by mouth 2 (two) times daily as needed for nausea or vomiting. 01/25/17   Tegeler, Canary Brimhristopher J, MD  traMADol (ULTRAM) 50 MG tablet Take 1 tablet (50 mg total) by mouth every 6 (six) hours as needed. 06/26/19 06/25/20  Jene EveryKinner, Robert, MD    Allergies Sulfa antibiotics  Family History  Problem Relation Age of Onset  . Alcohol abuse Neg Hx   . Arthritis Neg Hx   .  Asthma Neg Hx   . Birth defects Neg Hx   . Cancer Neg Hx   . COPD Neg Hx   . Depression Neg Hx   . Diabetes Neg Hx   . Drug abuse Neg Hx   . Early death Neg Hx   . Hearing loss Neg Hx   . Heart disease Neg Hx   . Hyperlipidemia Neg Hx   . Hypertension Neg Hx   . Kidney disease Neg Hx   . Learning disabilities Neg Hx   . Mental illness Neg Hx   . Mental retardation Neg Hx   . Miscarriages / Stillbirths Neg Hx   . Stroke Neg Hx   . Vision loss Neg Hx   . Varicose Veins Neg Hx     Social History Social History   Tobacco Use  . Smoking status: Never Smoker  . Smokeless tobacco: Never  Used  Substance Use Topics  . Alcohol use: No  . Drug use: No    Review of Systems Constitutional: No fever/chills Eyes: No visual changes. ENT: No sore throat. Cardiovascular: Denies chest pain. Respiratory: Denies shortness of breath. Gastrointestinal: No abdominal pain.  No nausea, no vomiting.  No diarrhea.  No constipation. Genitourinary: Negative for dysuria. Musculoskeletal: Negative for back pain. Skin: Negative for rash.  Laceration left lower leg. Neurological: Negative for headaches, focal weakness or numbness.  History of seizures. Allergic/Immunilogical: Sulfur antibiotics  ____________________________________________   PHYSICAL EXAM:  VITAL SIGNS: ED Triage Vitals  Enc Vitals Group     BP 07/10/19 1523 104/89     Pulse Rate 07/10/19 1523 96     Resp 07/10/19 1523 16     Temp 07/10/19 1523 98 F (36.7 C)     Temp Source 07/10/19 1523 Oral     SpO2 07/10/19 1523 100 %     Weight 07/10/19 1524 200 lb (90.7 kg)     Height 07/10/19 1524 5\' 7"  (1.702 m)     Head Circumference --      Peak Flow --      Pain Score 07/10/19 1524 8     Pain Loc --      Pain Edu? --      Excl. in GC? --    Constitutional: Alert and oriented. Well appearing and in no acute distress. Cardiovascular: Normal rate, regular rhythm. Grossly normal heart sounds.  Good peripheral circulation. Respiratory: Normal respiratory effort.  No retractions. Lungs CTAB. Musculoskeletal: Left lower extremity tenderness. Neurologic:  Normal speech and language. No gross focal neurologic deficits are appreciated. No gait instability. Skin: 8 cm laceration left lower leg. Psychiatric: Mood and affect are normal. Speech and behavior are normal.  ____________________________________________   LABS (all labs ordered are listed, but only abnormal results are displayed)  Labs Reviewed - No data to display ____________________________________________  EKG    ____________________________________________  RADIOLOGY  ED MD interpretation:    Official radiology report(s): No results found.  ____________________________________________   PROCEDURES  Procedure(s) performed (including Critical Care):  14/07/20Marland KitchenLaceration Repair  Date/Time: 07/10/2019 4:29 PM Performed by: 14/02/2019, PA-C Authorized by: Joni Reining, PA-C   Consent:    Consent obtained:  Verbal   Consent given by:  Patient   Risks discussed:  Infection, pain, poor cosmetic result and need for additional repair Anesthesia (see MAR for exact dosages):    Anesthesia method:  Local infiltration   Local anesthetic:  Lidocaine 1% w/o epi Laceration details:    Location:  Leg  Leg location:  L lower leg   Length (cm):  8   Depth (mm):  1 Repair type:    Repair type:  Simple Pre-procedure details:    Preparation:  Patient was prepped and draped in usual sterile fashion Exploration:    Contaminated: no   Treatment:    Area cleansed with:  Betadine and saline   Amount of cleaning:  Standard Skin repair:    Repair method:  Sutures   Suture size:  4-0   Suture material:  Nylon   Suture technique:  Simple interrupted   Number of sutures:  10 Approximation:    Approximation:  Close Post-procedure details:    Dressing:  Antibiotic ointment, non-adherent dressing and sterile dressing   Patient tolerance of procedure:  Tolerated well, no immediate complications     ____________________________________________   INITIAL IMPRESSION / ASSESSMENT AND PLAN / ED COURSE  As part of my medical decision making, I reviewed the following data within the [place]     Patient presents 8 cm laceration left lower leg.  Patient states tetanus up-to-date.  See procedure note for wound closure.  Patient given discharge care instruction and advised to have sutures removed in 10 days.    Carmen Harper was evaluated in Emergency Department on 07/10/2019 for  the symptoms described in the history of present illness. She was evaluated in the context of the global COVID-19 pandemic, which necessitated consideration that the patient might be at risk for infection with the SARS-CoV-2 virus that causes COVID-19. Institutional protocols and algorithms that pertain to the evaluation of patients at risk for COVID-19 are in a state of rapid change based on information released by regulatory bodies including the CDC and federal and state organizations. These policies and algorithms were followed during the patient's care in the ED.       ____________________________________________   FINAL CLINICAL IMPRESSION(S) / ED DIAGNOSES  Final diagnoses:  Laceration of left lower extremity, initial encounter     ED Discharge Orders         Ordered    oxyCODONE-acetaminophen (PERCOCET) 7.5-325 MG tablet  Every 6 hours PRN     07/10/19 1651           Note:  This document was prepared using Dragon voice recognition software and may include unintentional dictation errors.    Sable Feil, PA-C 07/10/19 1656    Harvest Dark, MD 07/11/19 1106

## 2019-07-10 NOTE — Discharge Instructions (Addendum)
Follow discharge care instruction have sutures removed by either your PCP or this department in 10 days.

## 2019-07-10 NOTE — ED Notes (Signed)
See triage note  States she dropped a crib  Metal piece cut her lower leg  Also states the crib came back on her  Having lower back pain

## 2019-07-10 NOTE — ED Notes (Signed)
See triage note.

## 2019-07-10 NOTE — ED Triage Notes (Signed)
Pt reports that she was moving a crib and it fell onto her left leg- laceration noted, pt reports then the crib rolled over onto her back and is now having lower back pain.

## 2019-07-16 ENCOUNTER — Encounter: Payer: Self-pay | Admitting: Emergency Medicine

## 2019-07-16 ENCOUNTER — Emergency Department
Admission: EM | Admit: 2019-07-16 | Discharge: 2019-07-16 | Disposition: A | Discharge status: Home / Self Care | Payer: Medicaid Other | Attending: Emergency Medicine | Admitting: Emergency Medicine

## 2019-07-16 ENCOUNTER — Other Ambulatory Visit: Payer: Self-pay

## 2019-07-16 DIAGNOSIS — Z4800 Encounter for change or removal of nonsurgical wound dressing: Secondary | ICD-10-CM | POA: Insufficient documentation

## 2019-07-16 DIAGNOSIS — Z5189 Encounter for other specified aftercare: Secondary | ICD-10-CM

## 2019-07-16 DIAGNOSIS — J45909 Unspecified asthma, uncomplicated: Secondary | ICD-10-CM | POA: Diagnosis not present

## 2019-07-16 MED ORDER — CLINDAMYCIN HCL 150 MG PO CAPS
300.0000 mg | ORAL_CAPSULE | Freq: Once | ORAL | Status: AC
  Administered 2019-07-16: 300 mg via ORAL
  Filled 2019-07-16: qty 2

## 2019-07-16 MED ORDER — MELOXICAM 15 MG PO TABS: 15.0000 mg | ORAL_TABLET | Freq: Every day | ORAL | 1 refills | Status: AC

## 2019-07-16 MED ORDER — CLINDAMYCIN HCL 300 MG PO CAPS: 300.0000 mg | ORAL_CAPSULE | Freq: Three times a day (TID) | ORAL | 0 refills | Status: AC

## 2019-07-16 MED ORDER — MUPIROCIN 2 % EX OINT: TOPICAL_OINTMENT | CUTANEOUS | 0 refills | Status: AC

## 2019-07-16 NOTE — ED Notes (Signed)
Pt unable to sign for discharge d/t signature pad not working- pt verbalized understanding of discharge instructions

## 2019-07-16 NOTE — ED Notes (Addendum)
Pt had sutures placed 12/7 to lower leg. Sutures are no longer there. When seen on the 9th by pcp sutures were in place. Area is dehisced.

## 2019-07-16 NOTE — ED Triage Notes (Signed)
Pt reports she was seen here several days ago for a laceration on her left lower leg. Reports the stitches popped out several days ago. Would like the area checked.

## 2019-07-16 NOTE — ED Provider Notes (Signed)
Emergency Department Provider Note  ____________________________________________  Time seen: Approximately 9:24 PM  I have reviewed the triage vital signs and the nursing notes.   HISTORY  Chief Complaint Wound Check   Historian Patient     HPI Carmen Harper is a 23 y.o. female presents to the emergency department for a wound check.  Patient had laceration repair conducted on 07/10/2019.  She reports that she suspected that laceration became infected despite taking Keflex and sutures "just fell out".  Wound has not dehisced and has surrounding cellulitis.  No streaking.  Patient denies fever or chills at home.  She is also complaining of pain around the affected area.   Past Medical History:  Diagnosis Date  . Anxiety   . Asthma   . Seizures (HCC)   . Thyroid disease      Immunizations up to date:  Yes.     Past Medical History:  Diagnosis Date  . Anxiety   . Asthma   . Seizures (HCC)   . Thyroid disease     Patient Active Problem List   Diagnosis Date Noted  . Anxiety 04/07/2016  . Vaginal delivery 04/06/2016  . Second-degree perineal laceration, with delivery 04/06/2016  . Premature beats 04/05/2016  . Positive GBS test 04/04/2016  . Asthma 04/04/2016    Past Surgical History:  Procedure Laterality Date  . breast tumor       Prior to Admission medications   Medication Sig Start Date End Date Taking? Authorizing Provider  albuterol (VENTOLIN HFA) 108 (90 Base) MCG/ACT inhaler Inhale 1-2 puffs into the lungs every 6 (six) hours as needed for wheezing or shortness of breath.     [provider]  bacitracin ointment Apply 1 application topically 2 (two) times daily. Apply to wounds on leg 04/29/18   Little, Ambrose Finlandachel Morgan, MD  beclomethasone (QVAR) 80 MCG/ACT inhaler Inhale 2 puffs into the lungs 2 (two) times daily.     [provider]  busPIRone HCl (BUSPAR PO) Take by mouth.    [provider]  cetirizine (ZYRTEC ALLERGY)  10 MG tablet Take 1 tablet (10 mg total) by mouth daily. 03/15/17   Everlene Farrieransie, William, PA-C  clindamycin (CLEOCIN) 300 MG capsule Take 1 capsule (300 mg total) by mouth 3 (three) times daily for 10 days. 07/16/19 07/26/19  Orvil FeilWoods, Nesanel Aguila M, PA-C  fluticasone (FLONASE) 50 MCG/ACT nasal spray Place 2 sprays into both nostrils daily. 03/15/17   Everlene Farrieransie, William, PA-C  HYDROcodone-acetaminophen (NORCO) 5-325 MG tablet Take 1 tablet by mouth every 4 (four) hours as needed for moderate pain. 07/05/19   Evon SlackGaines, Thomas C, PA-C  levETIRAcetam (KEPPRA) 500 MG tablet Take 1 tablet (500 mg total) by mouth at bedtime for 7 days. 03/27/19 04/03/19  Shaune PollackIsaacs, Cameron, MD  meloxicam (MOBIC) 15 MG tablet Take 1 tablet (15 mg total) by mouth daily for 7 days. 07/16/19 07/23/19  Orvil FeilWoods, Dodie Parisi M, PA-C  mupirocin ointment (BACTROBAN) 2 % Apply to affected area 3 times daily 07/16/19 07/15/20  Pia MauWoods, Monique Gift M, PA-C  ondansetron (ZOFRAN-ODT) 4 MG disintegrating tablet Take 1 tablet (4 mg total) by mouth every 8 (eight) hours as needed for nausea or vomiting. 06/06/19   Cuthriell, Delorise RoyalsJonathan D, PA-C  prochlorperazine (COMPAZINE) 10 MG tablet Take 1 tablet (10 mg total) by mouth 2 (two) times daily as needed for nausea or vomiting. 01/25/17   Tegeler, Canary Brimhristopher J, MD  traMADol (ULTRAM) 50 MG tablet Take 1 tablet (50 mg total) by mouth every 6 (six)  hours as needed. 06/26/19 06/25/20  Lavonia Drafts, MD    Allergies Sulfa antibiotics  Family History  Problem Relation Age of Onset  . Alcohol abuse Neg Hx   . Arthritis Neg Hx   . Asthma Neg Hx   . Birth defects Neg Hx   . Cancer Neg Hx   . COPD Neg Hx   . Depression Neg Hx   . Diabetes Neg Hx   . Drug abuse Neg Hx   . Early death Neg Hx   . Hearing loss Neg Hx   . Heart disease Neg Hx   . Hyperlipidemia Neg Hx   . Hypertension Neg Hx   . Kidney disease Neg Hx   . Learning disabilities Neg Hx   . Mental illness Neg Hx   . Mental retardation Neg Hx   . Miscarriages /  Stillbirths Neg Hx   . Stroke Neg Hx   . Vision loss Neg Hx   . Varicose Veins Neg Hx     Social History Social History   Tobacco Use  . Smoking status: Never Smoker  . Smokeless tobacco: Never Used  Substance Use Topics  . Alcohol use: No  . Drug use: No     Review of Systems  Constitutional: No fever/chills Eyes:  No discharge ENT: No upper respiratory complaints. Respiratory: no cough. No SOB/ use of accessory muscles to breath Gastrointestinal:   No nausea, no vomiting.  No diarrhea.  No constipation. Musculoskeletal: Negative for musculoskeletal pain. Skin: Patient is here for wound check.     ____________________________________________   PHYSICAL EXAM:  VITAL SIGNS: ED Triage Vitals [07/16/19 1948]  Enc Vitals Group     BP 137/79     Pulse Rate 96     Resp 18     Temp 99.8 F (37.7 C)     Temp Source Oral     SpO2 100 %     Weight 200 lb (90.7 kg)     Height 5\' 7"  (1.702 m)     Head Circumference      Peak Flow      Pain Score 7     Pain Loc      Pain Edu?      Excl. in [place]?      Constitutional: Alert and oriented. Well appearing and in no acute distress. Eyes: Conjunctivae are normal. PERRL. EOMI. Head: Atraumatic. ENT: Cardiovascular: Normal rate, regular rhythm. Normal S1 and S2.  Good peripheral circulation. Respiratory: Normal respiratory effort without tachypnea or retractions. Lungs CTAB. Good air entry to the bases with no decreased or absent breath sounds Gastrointestinal: Bowel sounds x 4 quadrants. Soft and nontender to palpation. No guarding or rigidity. No distention. Musculoskeletal: Full range of motion to all extremities. No obvious deformities noted Neurologic:  Normal for age. No gross focal neurologic deficits are appreciated.  Skin: Patient has approximately 5 cm dehisced laceration along left lower leg.  Patient has approximately 1 cm of surrounding cellulitis without streaking. Psychiatric: Mood and affect are normal for  age. Speech and behavior are normal.   ____________________________________________   LABS (all labs ordered are listed, but only abnormal results are displayed)  Labs Reviewed - No data to display ____________________________________________  EKG   ____________________________________________  RADIOLOGY   No results found.  ____________________________________________    PROCEDURES  Procedure(s) performed:     Procedures     Medications  clindamycin (CLEOCIN) capsule 300 mg (300 mg Oral Given 07/16/19 2112)  ____________________________________________   INITIAL IMPRESSION / ASSESSMENT AND PLAN / ED COURSE  Pertinent labs & imaging results that were available during my care of the patient were reviewed by me and considered in my medical decision making (see chart for details).      Assessment and plan Wound check 23 year old female presents to the emergency department for a wound check.  Patient has a 5 cm dehisced laceration along the left lower leg.  Patient reported that her sutures fell out.  Patient did have surrounding cellulitis around margins of wound.  Added clindamycin to patient's pharmacologic regimen and meloxicam for pain.  Patient was advised to follow-up with primary care as needed.  All patient questions were answered.    ____________________________________________  FINAL CLINICAL IMPRESSION(S) / ED DIAGNOSES  Final diagnoses:  Visit for wound check      NEW MEDICATIONS STARTED DURING THIS VISIT:  ED Discharge Orders         Ordered    clindamycin (CLEOCIN) 300 MG capsule  3 times daily     07/16/19 2102    mupirocin ointment (BACTROBAN) 2 %     07/16/19 2102    meloxicam (MOBIC) 15 MG tablet  Daily     07/16/19 2102              This chart was dictated using voice recognition software/Dragon. Despite best efforts to proofread, errors can occur which can change the meaning. Any change was purely  unintentional.     Gasper Lloyd 07/16/19 2128    Phineas Semen, MD 07/16/19 2131

## 2019-08-07 ENCOUNTER — Emergency Department
Admission: EM | Admit: 2019-08-07 | Discharge: 2019-08-07 | Disposition: A | Discharge status: Home / Self Care | Payer: Medicaid Other | Attending: Student | Admitting: Student

## 2019-08-07 ENCOUNTER — Emergency Department: Payer: Medicaid Other

## 2019-08-07 ENCOUNTER — Encounter: Payer: Self-pay | Admitting: Emergency Medicine

## 2019-08-07 ENCOUNTER — Other Ambulatory Visit: Payer: Self-pay

## 2019-08-07 DIAGNOSIS — Y929 Unspecified place or not applicable: Secondary | ICD-10-CM | POA: Diagnosis not present

## 2019-08-07 DIAGNOSIS — M25512 Pain in left shoulder: Secondary | ICD-10-CM

## 2019-08-07 DIAGNOSIS — Y9389 Activity, other specified: Secondary | ICD-10-CM | POA: Insufficient documentation

## 2019-08-07 DIAGNOSIS — Y999 Unspecified external cause status: Secondary | ICD-10-CM | POA: Insufficient documentation

## 2019-08-07 DIAGNOSIS — Z79899 Other long term (current) drug therapy: Secondary | ICD-10-CM | POA: Diagnosis not present

## 2019-08-07 DIAGNOSIS — M545 Low back pain, unspecified: Secondary | ICD-10-CM

## 2019-08-07 DIAGNOSIS — W01190A Fall on same level from slipping, tripping and stumbling with subsequent striking against furniture, initial encounter: Secondary | ICD-10-CM | POA: Diagnosis not present

## 2019-08-07 DIAGNOSIS — M546 Pain in thoracic spine: Secondary | ICD-10-CM

## 2019-08-07 DIAGNOSIS — J45909 Unspecified asthma, uncomplicated: Secondary | ICD-10-CM | POA: Diagnosis not present

## 2019-08-07 DIAGNOSIS — W19XXXA Unspecified fall, initial encounter: Secondary | ICD-10-CM

## 2019-08-07 DIAGNOSIS — N83201 Unspecified ovarian cyst, right side: Secondary | ICD-10-CM

## 2019-08-07 LAB — BASIC METABOLIC PANEL
Anion gap: 7 (ref 5–15)
BUN: 13 mg/dL (ref 6–20)
CO2: 25 mmol/L (ref 22–32)
Calcium: 8.8 mg/dL — ABNORMAL LOW (ref 8.9–10.3)
Chloride: 104 mmol/L (ref 98–111)
Creatinine, Ser: 0.59 mg/dL (ref 0.44–1.00)
GFR calc Af Amer: >60 mL/min (ref >60)
GFR calc non Af Amer: >60 mL/min (ref >60)
Glucose, Bld: 92 mg/dL (ref 70–99)
Potassium: 3.6 mmol/L (ref 3.5–5.1)
Sodium: 136 mmol/L (ref 135–145)

## 2019-08-07 LAB — CBC WITH DIFFERENTIAL/PLATELET
Abs Immature Granulocytes: 0.02 10*3/uL (ref 0.00–0.07)
Basophils Absolute: 0 10*3/uL (ref 0.0–0.1)
Basophils Relative: 1 %
Eosinophils Absolute: 0.1 10*3/uL (ref 0.0–0.5)
Eosinophils Relative: 1 %
HCT: 34.5 % — ABNORMAL LOW (ref 36.0–46.0)
Hemoglobin: 10.8 g/dL — ABNORMAL LOW (ref 12.0–15.0)
Immature Granulocytes: 0 %
Lymphocytes Relative: 30 %
Lymphs Abs: 2.1 10*3/uL (ref 0.7–4.0)
MCH: 25.4 pg — ABNORMAL LOW (ref 26.0–34.0)
MCHC: 31.3 g/dL (ref 30.0–36.0)
MCV: 81 fL (ref 80.0–100.0)
Monocytes Absolute: 0.4 10*3/uL (ref 0.1–1.0)
Monocytes Relative: 5 %
Neutro Abs: 4.6 10*3/uL (ref 1.7–7.7)
Neutrophils Relative %: 63 %
Platelets: 251 10*3/uL (ref 150–400)
RBC: 4.26 MIL/uL (ref 3.87–5.11)
RDW: 16.4 % — ABNORMAL HIGH (ref 11.5–15.5)
WBC: 7.2 10*3/uL (ref 4.0–10.5)
nRBC: 0 % (ref 0.0–0.2)

## 2019-08-07 LAB — POCT PREGNANCY, URINE: Preg Test, Ur: NEGATIVE

## 2019-08-07 MED ORDER — IOHEXOL 300 MG/ML  SOLN
100.0000 mL | Freq: Once | INTRAMUSCULAR | Status: AC | PRN
  Administered 2019-08-07: 100 mL via INTRAVENOUS
  Filled 2019-08-07: qty 100

## 2019-08-07 MED ORDER — SODIUM CHLORIDE 0.9 % IV BOLUS
1000.0000 mL | Freq: Once | INTRAVENOUS | Status: AC
  Administered 2019-08-07: 1000 mL via INTRAVENOUS

## 2019-08-07 MED ORDER — OXYCODONE HCL 5 MG PO TABS
5.0000 mg | ORAL_TABLET | Freq: Once | ORAL | Status: AC
  Administered 2019-08-07: 5 mg via ORAL
  Filled 2019-08-07: qty 1

## 2019-08-07 MED ORDER — ACETAMINOPHEN 500 MG PO TABS
1000.0000 mg | ORAL_TABLET | Freq: Once | ORAL | Status: AC
  Administered 2019-08-07: 1000 mg via ORAL
  Filled 2019-08-07: qty 2

## 2019-08-07 MED ORDER — KETOROLAC TROMETHAMINE 15 MG/ML IJ SOLN
15.0000 mg | Freq: Once | INTRAMUSCULAR | Status: AC
  Administered 2019-08-07: 15 mg via INTRAVENOUS
  Filled 2019-08-07: qty 1

## 2019-08-07 NOTE — ED Provider Notes (Signed)
[facility] Emergency Department Provider Note  ____________________________________________   First MD Initiated Contact with Patient 08/07/19 1617     (approximate)  I have reviewed the triage vital signs and the nursing notes.  History  Chief Complaint Back Injury    HPI Carmen Harper is a 24 y.o. female past medical history as below, who presents to the emergency department for a fall with resultant left shoulder and thoracic and lumbar back pain.  Patient states that she was moving furniture yesterday when she got her foot caught under a piece of furniture and fell back into a dresser.  She hit her left shoulder and lower back into the dresser.  Since then she has had left shoulder pain as well as lower back pain, primarily lower thoracic and lumbar.  Pain is aching, sharp, 5/10 in severity.  No radiation.  No alleviating or aggravating factors.  Discomfort has been constant since onset from the fall yesterday.  No LE weakness, numbness, tingling. No hematuria. No issues with bowel or bladder control.   She denies any head injury or loss of consciousness.  She is not on any blood thinning medications.  No history of vascular disease or bleeding problems.   Past Medical Hx Past Medical History:  Diagnosis Date  . Anxiety   . Asthma   . Seizures (HCC)   . Thyroid disease     Problem List Patient Active Problem List   Diagnosis Date Noted  . Anxiety 04/07/2016  . Vaginal delivery 04/06/2016  . Second-degree perineal laceration, with delivery 04/06/2016  . Premature beats 04/05/2016  . Positive GBS test 04/04/2016  . Asthma 04/04/2016    Past Surgical Hx Past Surgical History:  Procedure Laterality Date  . breast tumor       Medications Prior to Admission medications   Medication Sig Start Date End Date Taking? Authorizing Provider  albuterol (VENTOLIN HFA) 108 (90 Base) MCG/ACT inhaler Inhale 1-2 puffs into the lungs every 6 (six)  hours as needed for wheezing or shortness of breath.     [provider]  bacitracin ointment Apply 1 application topically 2 (two) times daily. Apply to wounds on leg 04/29/18   Little, Ambrose Finland, MD  beclomethasone (QVAR) 80 MCG/ACT inhaler Inhale 2 puffs into the lungs 2 (two) times daily.     [provider]  busPIRone HCl (BUSPAR PO) Take by mouth.    [provider]  cetirizine (ZYRTEC ALLERGY) 10 MG tablet Take 1 tablet (10 mg total) by mouth daily. 03/15/17   Everlene Farrier, PA-C  fluticasone (FLONASE) 50 MCG/ACT nasal spray Place 2 sprays into both nostrils daily. 03/15/17   Everlene Farrier, PA-C  HYDROcodone-acetaminophen (NORCO) 5-325 MG tablet Take 1 tablet by mouth every 4 (four) hours as needed for moderate pain. 07/05/19   Evon Slack, PA-C  levETIRAcetam (KEPPRA) 500 MG tablet Take 1 tablet (500 mg total) by mouth at bedtime for 7 days. 03/27/19 04/03/19  Shaune Pollack, MD  mupirocin ointment Idelle Jo) 2 % Apply to affected area 3 times daily 07/16/19 07/15/20  Pia Mau M, PA-C  ondansetron (ZOFRAN-ODT) 4 MG disintegrating tablet Take 1 tablet (4 mg total) by mouth every 8 (eight) hours as needed for nausea or vomiting. 06/06/19   Cuthriell, Delorise Royals, PA-C  prochlorperazine (COMPAZINE) 10 MG tablet Take 1 tablet (10 mg total) by mouth 2 (two) times daily as needed for nausea or vomiting. 01/25/17   Tegeler, Canary Brim, MD  traMADol Janean Sark)  50 MG tablet Take 1 tablet (50 mg total) by mouth every 6 (six) hours as needed. 06/26/19 06/25/20  Lavonia Drafts, MD    Allergies Sulfa antibiotics  Family Hx Family History  Problem Relation Age of Onset  . Alcohol abuse Neg Hx   . Arthritis Neg Hx   . Asthma Neg Hx   . Birth defects Neg Hx   . Cancer Neg Hx   . COPD Neg Hx   . Depression Neg Hx   . Diabetes Neg Hx   . Drug abuse Neg Hx   . Early death Neg Hx   . Hearing loss Neg Hx   . Heart disease Neg Hx   . Hyperlipidemia Neg Hx   .  Hypertension Neg Hx   . Kidney disease Neg Hx   . Learning disabilities Neg Hx   . Mental illness Neg Hx   . Mental retardation Neg Hx   . Miscarriages / Stillbirths Neg Hx   . Stroke Neg Hx   . Vision loss Neg Hx   . Varicose Veins Neg Hx     Social Hx Social History   Tobacco Use  . Smoking status: Never Smoker  . Smokeless tobacco: Never Used  Substance Use Topics  . Alcohol use: No  . Drug use: No     Review of Systems  Constitutional: Negative for fever, chills. + fall Eyes: Negative for visual changes. ENT: Negative for sore throat. Cardiovascular: Negative for chest pain. Respiratory: Negative for shortness of breath. Gastrointestinal: Negative for nausea, vomiting.  Genitourinary: Negative for dysuria. Musculoskeletal: + LEFT shoulder pain, lower back pain Skin: Negative for rash. Neurological: Negative for for headaches.   Physical Exam  Vital Signs: ED Triage Vitals  Enc Vitals Group     BP 08/07/19 1502 125/82     Pulse Rate 08/07/19 1502 68     Resp 08/07/19 1502 18     Temp 08/07/19 1502 98.8 F (37.1 C)     Temp Source 08/07/19 1502 Oral     SpO2 08/07/19 1502 100 %     Weight 08/07/19 1518 190 lb (86.2 kg)     Height 08/07/19 1518 5\' 7"  (1.702 m)     Head Circumference --      Peak Flow --      Pain Score 08/07/19 1517 7     Pain Loc --      Pain Edu? --      Excl. in [place]? --     Constitutional: Alert and oriented.  Head: Normocephalic. Atraumatic. Eyes: Conjunctivae clear. Sclera anicteric. Nose: No congestion. No rhinorrhea. Mouth/Throat: Wearing mask.  Neck: No stridor.   Cardiovascular: Normal rate, regular rhythm. Extremities well perfused. Respiratory: Normal respiratory effort.  Lungs CTAB. Gastrointestinal: Soft. Non-tender. Non-distended.  Musculoskeletal: No lower extremity edema. No deformities. TTP along the left, upper trapezius area. FROM at shoulder, though does illicit discomfort.  Back: Lower thoracic and lumbar TTP.  No step offs.  Neurologic:  Normal speech and language. No gross focal neurologic deficits are appreciated. BLE strength 5/5 and symmetric. SILT.  Skin: Reticular like ecchymosis overlying the thoracic and lumbar back area.  Psychiatric: Mood and affect are appropriate for situation.   Radiology  XR LEFT shoulder: IMPRESSION:  Negative  CT A/P: IMPRESSION:  1. No acute abdominopelvic abnormality.  2. A 3.3 cm right ovarian cystic mass. Follow-up ultrasound is recommended in 6-8 weeks for further evaluation of this finding.   Updated patient on this incidental finding  and advised follow up with OB/GYN.  CT T spine: IMPRESSION:  No evidence of acute osseous abnormality.   CT L spine: IMPRESSION:  No evidence of acute osseous abnormality in the lumbar spine.    Procedures  Procedure(s) performed (including critical care):  Procedures   Initial Impression / Assessment and Plan / ED Course  24 y.o. female who presents to the ED for left shoulder, and lower back pain, as above.  On exam, she has tenderness to the lower thoracic and lumbar spine, with overlying atypical appearing, almost reticular-like ecchymosis.  Patient denies any history of vascular disease or vasculitis.  Given the appearance, will obtain CT imaging to rule out any retroperitoneal bleeding, as well as CT imaging of the T and L-spine, XR shoulder.  Imaging is all negative for any acute traumatic injuries. Patient feeling improved. Incidentally seen ovarian cystic mass on CT, updated patient on this and need for follow up with OBGYN. Advised continued supportive care with ibuprofen/APAP and given return precautions. Patient comfortable w/ plan.    Final Clinical Impression(s) / ED Diagnosis  Final diagnoses:  Fall  Acute pain of left shoulder  Lumbar back pain  Acute midline thoracic back pain       Note:  This document was prepared using Dragon voice recognition software and may include unintentional  dictation errors.   Miguel Aschoff., MD 08/07/19 [phone]

## 2019-08-07 NOTE — ED Notes (Signed)
Pt ambulatory to room.

## 2019-08-07 NOTE — Discharge Instructions (Signed)
Thank you for letting us take care of you in the emergency department today.   Please continue to take any regular, prescribed medications. Use over the counter ibuprofen and Tylenol to help with aches/pains.   Please follow up with: - Your primary care doctor to review your ER visit and follow up on your symptoms.  - OB/GYN doctor to follow up on your RIGHT sided ovarian findings  Please return to the ER for any new or worsening symptoms.

## 2019-08-07 NOTE — ED Notes (Signed)
See triage note, pt reports she fell last night hitting her lower/mid back then hitting her left shoulder on a piece of furniture. Pt noted to have spotted like bruising on her entire lower back.

## 2019-08-07 NOTE — ED Triage Notes (Addendum)
Here for low/mid back pain to middle of back.  Ambulatory to triage. Was moving new furniture and foot got stuck under it and she fell back into dresser.  NAD. Unlabored. also c/o pain to left shoulder. No hematuria.  Severe spider web like bruise pattern across entire lower/mid back.

## 2019-08-28 ENCOUNTER — Other Ambulatory Visit: Payer: Self-pay

## 2019-08-28 ENCOUNTER — Emergency Department
Admission: EM | Admit: 2019-08-28 | Discharge: 2019-08-28 | Disposition: A | Discharge status: Home / Self Care | Payer: Medicaid Other | Attending: Emergency Medicine | Admitting: Emergency Medicine

## 2019-08-28 ENCOUNTER — Encounter: Payer: Self-pay | Admitting: Emergency Medicine

## 2019-08-28 DIAGNOSIS — J45909 Unspecified asthma, uncomplicated: Secondary | ICD-10-CM | POA: Insufficient documentation

## 2019-08-28 DIAGNOSIS — R519 Headache, unspecified: Secondary | ICD-10-CM

## 2019-08-28 MED ORDER — ACETAMINOPHEN 500 MG PO TABS
1000.0000 mg | ORAL_TABLET | Freq: Once | ORAL | Status: AC
  Administered 2019-08-28: 1000 mg via ORAL
  Filled 2019-08-28: qty 2

## 2019-08-28 MED ORDER — LORAZEPAM 0.5 MG PO TABS: 0.5000 mg | ORAL_TABLET | Freq: Four times a day (QID) | ORAL | 0 refills | Status: AC | PRN

## 2019-08-28 MED ORDER — METOCLOPRAMIDE HCL 5 MG/ML IJ SOLN
10.0000 mg | Freq: Once | INTRAMUSCULAR | Status: AC
  Administered 2019-08-28: 10 mg via INTRAVENOUS
  Filled 2019-08-28: qty 2

## 2019-08-28 MED ORDER — SODIUM CHLORIDE 0.9 % IV BOLUS: 1000.0000 mL | Freq: Once | INTRAVENOUS | Status: DC

## 2019-08-28 MED ORDER — DIPHENHYDRAMINE HCL 50 MG/ML IJ SOLN
12.5000 mg | Freq: Once | INTRAMUSCULAR | Status: AC
  Administered 2019-08-28: 12.5 mg via INTRAVENOUS
  Filled 2019-08-28: qty 1

## 2019-08-28 MED ORDER — KETOROLAC TROMETHAMINE 30 MG/ML IJ SOLN
15.0000 mg | Freq: Once | INTRAMUSCULAR | Status: AC
  Administered 2019-08-28: 15 mg via INTRAVENOUS
  Filled 2019-08-28: qty 1

## 2019-08-28 NOTE — ED Triage Notes (Signed)
Pr reports that she has had a continuous migraine since Friday when her mother committed suicide at her home on Friday. Pt has hx/o migraines and seizures. Pt denies seizure activity. Pt has photosensitivity. Pt denies SI and HI at this time.

## 2019-08-28 NOTE — ED Provider Notes (Signed)
[facility] Emergency Department Provider Note  ____________________________________________   First MD Initiated Contact with Patient 08/28/19 2141     (approximate)  I have reviewed the triage vital signs and the nursing notes.   HISTORY  Chief Complaint Headache    HPI Carmen Harper is a 24 y.o. female with history of seizures who comes in for a headache.  Patient had a headache since Friday.  Patient does have a new stressor in her life and that her mom did commit suicide at home on Friday.  She has some sensitivity to light.  She denies any SI or HI.  Patient's headache was not sudden onset or thunderclap.  She is a headaches previously.  No neuro deficits noted.  Patient has been taking triptans without relief in symptoms.  Patient states that headache is severe, constant, onset since Friday, worse with light.  Patient states that she also wants a refill of Ativan.  She states she last took it on Saturday.  She states that her family doctor prescribed her 5 pills of 0.5 but normally she takes 2 mg and did not really help her with her sleep.  I reviewed the database I do not see any fields for Ativan and the note from her primary doctor states that she would not prescribe benzodiazepines.  When I asked patient this she stated that she did give her 5 pills and she is not sure why is not showing up.  Pharmacy did confirm that she did have a fill at her pharmacy.          Past Medical History:  Diagnosis Date  . Anxiety   . Asthma   . Seizures ([place])   . Thyroid disease     Patient Active Problem List   Diagnosis Date Noted  . Anxiety 04/07/2016  . Vaginal delivery 04/06/2016  . Second-degree perineal laceration, with delivery 04/06/2016  . Premature beats 04/05/2016  . Positive GBS test 04/04/2016  . Asthma 04/04/2016    Past Surgical History:  Procedure Laterality Date  . breast tumor       Prior to Admission medications   Medication  Sig Start Date End Date Taking? Authorizing Provider  albuterol (VENTOLIN HFA) 108 (90 Base) MCG/ACT inhaler Inhale 1-2 puffs into the lungs every 6 (six) hours as needed for wheezing or shortness of breath.     [provider]  bacitracin ointment Apply 1 application topically 2 (two) times daily. Apply to wounds on leg 04/29/18   Little, Wenda Overland, MD  beclomethasone (QVAR) 80 MCG/ACT inhaler Inhale 2 puffs into the lungs 2 (two) times daily.     [provider]  busPIRone HCl (BUSPAR PO) Take by mouth.    [provider]  cetirizine (ZYRTEC ALLERGY) 10 MG tablet Take 1 tablet (10 mg total) by mouth daily. 03/15/17   Waynetta Pean, PA-C  fluticasone (FLONASE) 50 MCG/ACT nasal spray Place 2 sprays into both nostrils daily. 03/15/17   Waynetta Pean, PA-C  HYDROcodone-acetaminophen (NORCO) 5-325 MG tablet Take 1 tablet by mouth every 4 (four) hours as needed for moderate pain. 07/05/19   Duanne Guess, PA-C  levETIRAcetam (KEPPRA) 500 MG tablet Take 1 tablet (500 mg total) by mouth at bedtime for 7 days. 03/27/19 04/03/19  Duffy Bruce, MD  mupirocin ointment Drue Stager) 2 % Apply to affected area 3 times daily 07/16/19 07/15/20  Vallarie Mare M, PA-C  ondansetron (ZOFRAN-ODT) 4 MG disintegrating tablet Take 1 tablet (4 mg total)  by mouth every 8 (eight) hours as needed for nausea or vomiting. 06/06/19   Cuthriell, Delorise Royals, PA-C  prochlorperazine (COMPAZINE) 10 MG tablet Take 1 tablet (10 mg total) by mouth 2 (two) times daily as needed for nausea or vomiting. 01/25/17   Tegeler, Canary Brim, MD  traMADol (ULTRAM) 50 MG tablet Take 1 tablet (50 mg total) by mouth every 6 (six) hours as needed. 06/26/19 06/25/20  Jene Every, MD    Allergies Sulfa antibiotics  Family History  Problem Relation Age of Onset  . Alcohol abuse Neg Hx   . Arthritis Neg Hx   . Asthma Neg Hx   . Birth defects Neg Hx   . Cancer Neg Hx   . COPD Neg Hx   . Depression Neg Hx   .  Diabetes Neg Hx   . Drug abuse Neg Hx   . Early death Neg Hx   . Hearing loss Neg Hx   . Heart disease Neg Hx   . Hyperlipidemia Neg Hx   . Hypertension Neg Hx   . Kidney disease Neg Hx   . Learning disabilities Neg Hx   . Mental illness Neg Hx   . Mental retardation Neg Hx   . Miscarriages / Stillbirths Neg Hx   . Stroke Neg Hx   . Vision loss Neg Hx   . Varicose Veins Neg Hx     Social History Social History   Tobacco Use  . Smoking status: Never Smoker  . Smokeless tobacco: Never Used  Substance Use Topics  . Alcohol use: No  . Drug use: No      Review of Systems Constitutional: No fever/chills Eyes: No visual changes. ENT: No sore throat. Cardiovascular: Denies chest pain. Respiratory: Denies shortness of breath. Gastrointestinal: No abdominal pain.  No nausea, no vomiting.  No diarrhea.  No constipation. Genitourinary: Negative for dysuria. Musculoskeletal: Negative for back pain. Skin: Negative for rash. Neurological: Positive for headache, no focal weakness or numbness.  Positive for some anxiety. All other ROS negative ____________________________________________   PHYSICAL EXAM:  VITAL SIGNS: ED Triage Vitals [08/28/19 2041]  Enc Vitals Group     BP (!) 150/91     Pulse Rate (!) 104     Resp 18     Temp 98.2 F (36.8 C)     Temp Source Oral     SpO2 100 %     Weight      Height      Head Circumference      Peak Flow      Pain Score      Pain Loc      Pain Edu?      Excl. in GC?     Constitutional: Alert and oriented. Well appearing and in no acute distress. Eyes: Conjunctivae are normal. EOMI. Head: Atraumatic. Nose: No congestion/rhinnorhea. Mouth/Throat: Mucous membranes are moist.   Neck: No stridor. Trachea Midline. FROM Cardiovascular: Tachycardic, regular rhythm. Grossly normal heart sounds.  Good peripheral circulation. Respiratory: Normal respiratory effort.  No retractions. Lungs CTAB. Gastrointestinal: Soft and nontender.  No distention. No abdominal bruits.  Musculoskeletal: No lower extremity tenderness nor edema.  No joint effusions. Neurologic:  Normal speech and language. No gross focal neurologic deficits are appreciated.  Equal strength in the arms and legs. Skin:  Skin is warm, dry and intact. No rash noted. Psychiatric: Mood and affect are normal. Speech and behavior are normal.  Patient is anxious. GU: Deferred   ____________________________________________   LABS (  all labs ordered are listed, but only abnormal results are displayed)  Labs Reviewed  POC URINE PREG, ED   ____________________________________________   PROCEDURES  Procedure(s) performed (including Critical Care):  Procedures   ____________________________________________   INITIAL IMPRESSION / ASSESSMENT AND PLAN / ED COURSE  Carmen Harper was evaluated in Emergency Department on 08/28/2019 for the symptoms described in the history of present illness. She was evaluated in the context of the global COVID-19 pandemic, which necessitated consideration that the patient might be at risk for infection with the SARS-CoV-2 virus that causes COVID-19. Institutional protocols and algorithms that pertain to the evaluation of patients at risk for COVID-19 are in a state of rapid change based on information released by regulatory bodies including the CDC and federal and state organizations. These policies and algorithms were followed during the patient's care in the ED.    Patient presents with headache with obvious stressor from her parents death.  Will give migraine cocktail.  Low suspicion for intracranial hemorrhage, tumor.  Patient has normal neuro exam.  Low suspicion for thrombus.  Patient denies any SI or HI. Pt states no chance of being pregnant because she just had her period.   Patient given migraine cocktail patient symptoms are getting better.  Discussed with patient I would give her a few Ativan 0.5 x4 but she is to  follow-up with psychiatry.  ____________________________________________   FINAL CLINICAL IMPRESSION(S) / ED DIAGNOSES   Final diagnoses:  Headache disorder      MEDICATIONS GIVEN DURING THIS VISIT:  Medications  metoCLOPramide (REGLAN) injection 10 mg (10 mg Intravenous Given 08/28/19 2224)  diphenhydrAMINE (BENADRYL) injection 12.5 mg (12.5 mg Intravenous Given 08/28/19 2224)  acetaminophen (TYLENOL) tablet 1,000 mg (1,000 mg Oral Given 08/28/19 2224)  sodium chloride 0.9 % bolus 1,000 mL (1,000 mLs Intravenous New Bag/Given 08/28/19 2231)  ketorolac (TORADOL) 30 MG/ML injection 15 mg (15 mg Intravenous Given 08/28/19 2223)     ED Discharge Orders         Ordered    LORazepam (ATIVAN) 0.5 MG tablet  Every 6 hours PRN     08/28/19 2330           Note:  This document was prepared using Dragon voice recognition software and may include unintentional dictation errors.   Concha Se, MD 08/28/19 2330

## 2019-08-28 NOTE — Discharge Instructions (Signed)
We have given you a migraine cocktail.  We have prescribed you a few Ativan.  Will be important you follow-up with your psychiatrist this week.  Return to the ER for any other concerns.

## 2019-09-01 ENCOUNTER — Emergency Department
Admission: EM | Admit: 2019-09-01 | Discharge: 2019-09-01 | Disposition: A | Discharge status: Home / Self Care | Payer: Medicaid Other | Attending: Emergency Medicine | Admitting: Emergency Medicine

## 2019-09-01 ENCOUNTER — Encounter: Payer: Self-pay | Admitting: Emergency Medicine

## 2019-09-01 ENCOUNTER — Other Ambulatory Visit: Payer: Self-pay

## 2019-09-01 DIAGNOSIS — Y999 Unspecified external cause status: Secondary | ICD-10-CM | POA: Insufficient documentation

## 2019-09-01 DIAGNOSIS — J45909 Unspecified asthma, uncomplicated: Secondary | ICD-10-CM | POA: Insufficient documentation

## 2019-09-01 DIAGNOSIS — Y9389 Activity, other specified: Secondary | ICD-10-CM | POA: Diagnosis not present

## 2019-09-01 DIAGNOSIS — S51812A Laceration without foreign body of left forearm, initial encounter: Secondary | ICD-10-CM | POA: Insufficient documentation

## 2019-09-01 DIAGNOSIS — W268XXA Contact with other sharp object(s), not elsewhere classified, initial encounter: Secondary | ICD-10-CM | POA: Diagnosis not present

## 2019-09-01 DIAGNOSIS — Z79899 Other long term (current) drug therapy: Secondary | ICD-10-CM | POA: Insufficient documentation

## 2019-09-01 DIAGNOSIS — Y929 Unspecified place or not applicable: Secondary | ICD-10-CM | POA: Diagnosis not present

## 2019-09-01 NOTE — ED Provider Notes (Signed)
Emergency Department Provider Note  ____________________________________________  Time seen: Approximately 3:39 PM  I have reviewed the triage vital signs and the nursing notes.   HISTORY  Chief Complaint Laceration   Historian Patient     HPI Carmen Harper is a 24 y.o. female presents to the emergency department with a 3 cm abrasion along the left forearm sustained accidentally while she was taking out the trash earlier in the day.  Wound has been bleeding since injury occurred and she is concerned that she may need stitches.  No numbness or tingling in the left forearm.  Patient's tetanus status is up-to-date.  No other alleviating measures have been attempted.   Past Medical History:  Diagnosis Date  . Anxiety   . Asthma   . Seizures ([place])   . Thyroid disease      Immunizations up to date:  Yes.     Past Medical History:  Diagnosis Date  . Anxiety   . Asthma   . Seizures ([place])   . Thyroid disease     Patient Active Problem List   Diagnosis Date Noted  . Anxiety 04/07/2016  . Vaginal delivery 04/06/2016  . Second-degree perineal laceration, with delivery 04/06/2016  . Premature beats 04/05/2016  . Positive GBS test 04/04/2016  . Asthma 04/04/2016    Past Surgical History:  Procedure Laterality Date  . breast tumor       Prior to Admission medications   Medication Sig Start Date End Date Taking? Authorizing Provider  albuterol (VENTOLIN HFA) 108 (90 Base) MCG/ACT inhaler Inhale 1-2 puffs into the lungs every 6 (six) hours as needed for wheezing or shortness of breath.     [provider]  bacitracin ointment Apply 1 application topically 2 (two) times daily. Apply to wounds on leg 04/29/18   Little, Wenda Overland, MD  beclomethasone (QVAR) 80 MCG/ACT inhaler Inhale 2 puffs into the lungs 2 (two) times daily.     [provider]  busPIRone HCl (BUSPAR PO) Take by mouth.    [provider]  cetirizine (ZYRTEC ALLERGY) 10  MG tablet Take 1 tablet (10 mg total) by mouth daily. 03/15/17   Waynetta Pean, PA-C  fluticasone (FLONASE) 50 MCG/ACT nasal spray Place 2 sprays into both nostrils daily. 03/15/17   Waynetta Pean, PA-C  HYDROcodone-acetaminophen (NORCO) 5-325 MG tablet Take 1 tablet by mouth every 4 (four) hours as needed for moderate pain. 07/05/19   Duanne Guess, PA-C  levETIRAcetam (KEPPRA) 500 MG tablet Take 1 tablet (500 mg total) by mouth at bedtime for 7 days. 03/27/19 04/03/19  Duffy Bruce, MD  mupirocin ointment Drue Stager) 2 % Apply to affected area 3 times daily 07/16/19 07/15/20  Vallarie Mare M, PA-C  ondansetron (ZOFRAN-ODT) 4 MG disintegrating tablet Take 1 tablet (4 mg total) by mouth every 8 (eight) hours as needed for nausea or vomiting. 06/06/19   Cuthriell, Charline Bills, PA-C  prochlorperazine (COMPAZINE) 10 MG tablet Take 1 tablet (10 mg total) by mouth 2 (two) times daily as needed for nausea or vomiting. 01/25/17   Tegeler, Gwenyth Allegra, MD  traMADol (ULTRAM) 50 MG tablet Take 1 tablet (50 mg total) by mouth every 6 (six) hours as needed. 06/26/19 06/25/20  Lavonia Drafts, MD    Allergies Sulfa antibiotics  Family History  Problem Relation Age of Onset  . Alcohol abuse Neg Hx   . Arthritis Neg Hx   . Asthma Neg Hx   . Birth defects Neg Hx   . Cancer  Neg Hx   . COPD Neg Hx   . Depression Neg Hx   . Diabetes Neg Hx   . Drug abuse Neg Hx   . Early death Neg Hx   . Hearing loss Neg Hx   . Heart disease Neg Hx   . Hyperlipidemia Neg Hx   . Hypertension Neg Hx   . Kidney disease Neg Hx   . Learning disabilities Neg Hx   . Mental illness Neg Hx   . Mental retardation Neg Hx   . Miscarriages / Stillbirths Neg Hx   . Stroke Neg Hx   . Vision loss Neg Hx   . Varicose Veins Neg Hx     Social History Social History   Tobacco Use  . Smoking status: Never Smoker  . Smokeless tobacco: Never Used  Substance Use Topics  . Alcohol use: No  . Drug use: No     Review of  Systems  Constitutional: No fever/chills Eyes:  No discharge ENT: No upper respiratory complaints. Respiratory: no cough. No SOB/ use of accessory muscles to breath Gastrointestinal:   No nausea, no vomiting.  No diarrhea.  No constipation. Musculoskeletal: Negative for musculoskeletal pain. Skin: Patient has left forearm abrasion.    ____________________________________________   PHYSICAL EXAM:  VITAL SIGNS: ED Triage Vitals  Enc Vitals Group     BP 09/01/19 1450 108/72     Pulse Rate 09/01/19 1450 (!) 110     Resp 09/01/19 1450 18     Temp 09/01/19 1455 98.5 F (36.9 C)     Temp Source 09/01/19 1455 Oral     SpO2 09/01/19 1450 98 %     Weight 09/01/19 1448 190 lb (86.2 kg)     Height 09/01/19 1448 5\' 7"  (1.702 m)     Head Circumference --      Peak Flow --      Pain Score 09/01/19 1448 6     Pain Loc --      Pain Edu? --      Excl. in GC? --      Constitutional: Alert and oriented. Well appearing and in no acute distress. Eyes: Conjunctivae are normal. PERRL. EOMI. Head: Atraumatic. Cardiovascular: Normal rate, regular rhythm. Normal S1 and S2.  Good peripheral circulation. Respiratory: Normal respiratory effort without tachypnea or retractions. Lungs CTAB. Good air entry to the bases with no decreased or absent breath sounds Gastrointestinal: Bowel sounds x 4 quadrants. Soft and nontender to palpation. No guarding or rigidity. No distention. Musculoskeletal: Full range of motion to all extremities. No obvious deformities noted Neurologic:  Normal for age. No gross focal neurologic deficits are appreciated.  Skin: Patient has 3 cm left forearm abrasion.  Psychiatric: Mood and affect are normal for age. Speech and behavior are normal.   ____________________________________________   LABS (all labs ordered are listed, but only abnormal results are displayed)  Labs Reviewed - No data to  display ____________________________________________  EKG   ____________________________________________  RADIOLOGY   No results found.  ____________________________________________    PROCEDURES  Procedure(s) performed:     Procedures  LACERATION REPAIR Performed by: 09/03/19 Authorized by: Orvil Feil Consent: Verbal consent obtained. Risks and benefits: risks, benefits and alternatives were discussed Consent given by: patient Patient identity confirmed: provided demographic data Prepped and Draped in normal sterile fashion Wound explored  Laceration Location: Left forearm   Laceration Length: 3 cm  No Foreign Bodies seen or palpated  Anesthesia: local infiltration  Irrigation  method: syringe Amount of cleaning: standard  Skin closure: Dermabond   Patient tolerance: Patient tolerated the procedure well with no immediate complications.    Medications - No data to display   ____________________________________________   INITIAL IMPRESSION / ASSESSMENT AND PLAN / ED COURSE  Pertinent labs & imaging results that were available during my care of the patient were reviewed by me and considered in my medical decision making (see chart for details).      Assessment and Plan:  Forearm abrasion 24 year old female presents to the emergency department with a left forearm abrasion repaired in the emergency department using Dermabond.  Tylenol was recommended for discomfort.  All patient questions were answered.   ____________________________________________  FINAL CLINICAL IMPRESSION(S) / ED DIAGNOSES  Final diagnoses:  Laceration of left forearm, initial encounter      NEW MEDICATIONS STARTED DURING THIS VISIT:  ED Discharge Orders    None          This chart was dictated using voice recognition software/Dragon. Despite best efforts to proofread, errors can occur which can change the meaning. Any change was purely  unintentional.     Orvil Feil, PA-C 09/01/19 1544    Emily Filbert, MD 09/01/19 [phone]

## 2019-09-01 NOTE — ED Triage Notes (Signed)
Pt here for superficial laceration to left forearm.  Was throwing trash into dumpster and hit arm on something metal.  Last Tdap < 5 yrs

## 2019-09-01 NOTE — ED Notes (Signed)
Pt states was putting garbage in a bin and scraped her arm on the dumpster. Wound cleaned and covered for the dr to see.

## 2021-07-27 IMAGING — CR DG FOOT COMPLETE 3+V*L*
3 series · 3 of 3 positions shown · non-contrast
Comparison: March 30, 2019

CLINICAL DATA: Fall, pain

EXAM:
LEFT FOOT - COMPLETE 3+ VIEW

[foot ap]
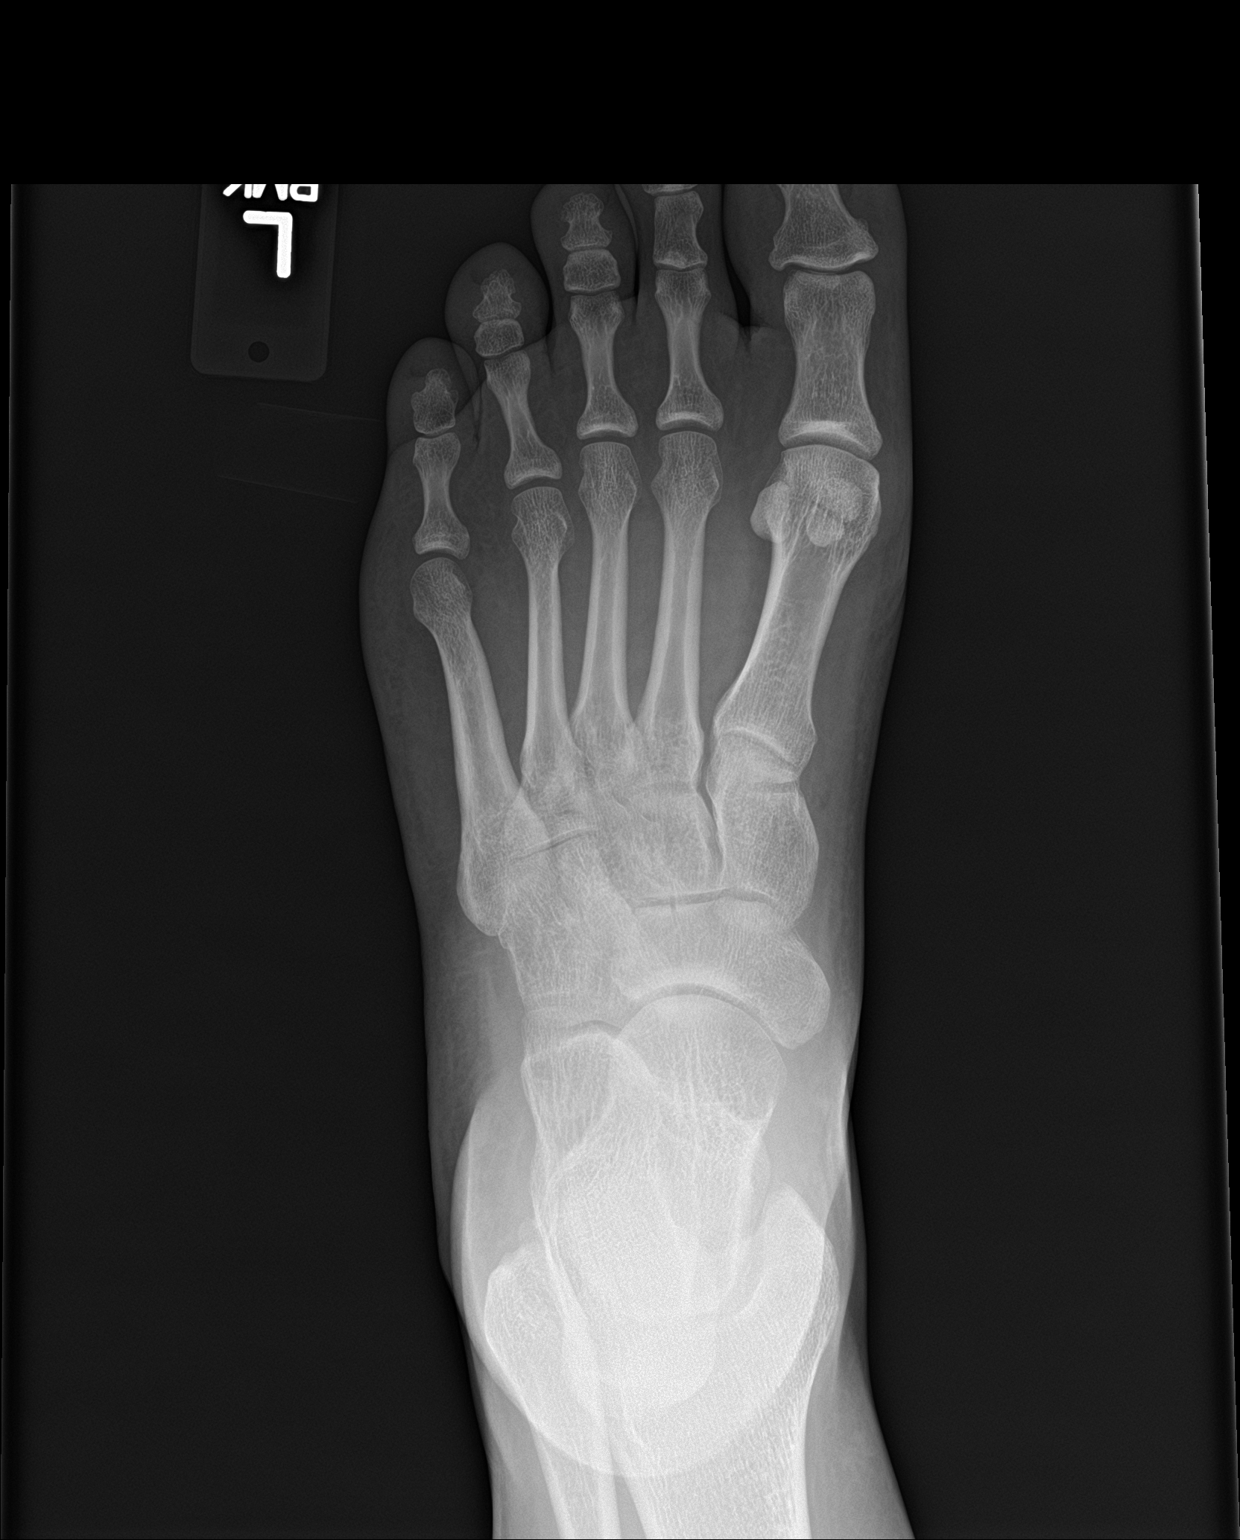

[foot obl]
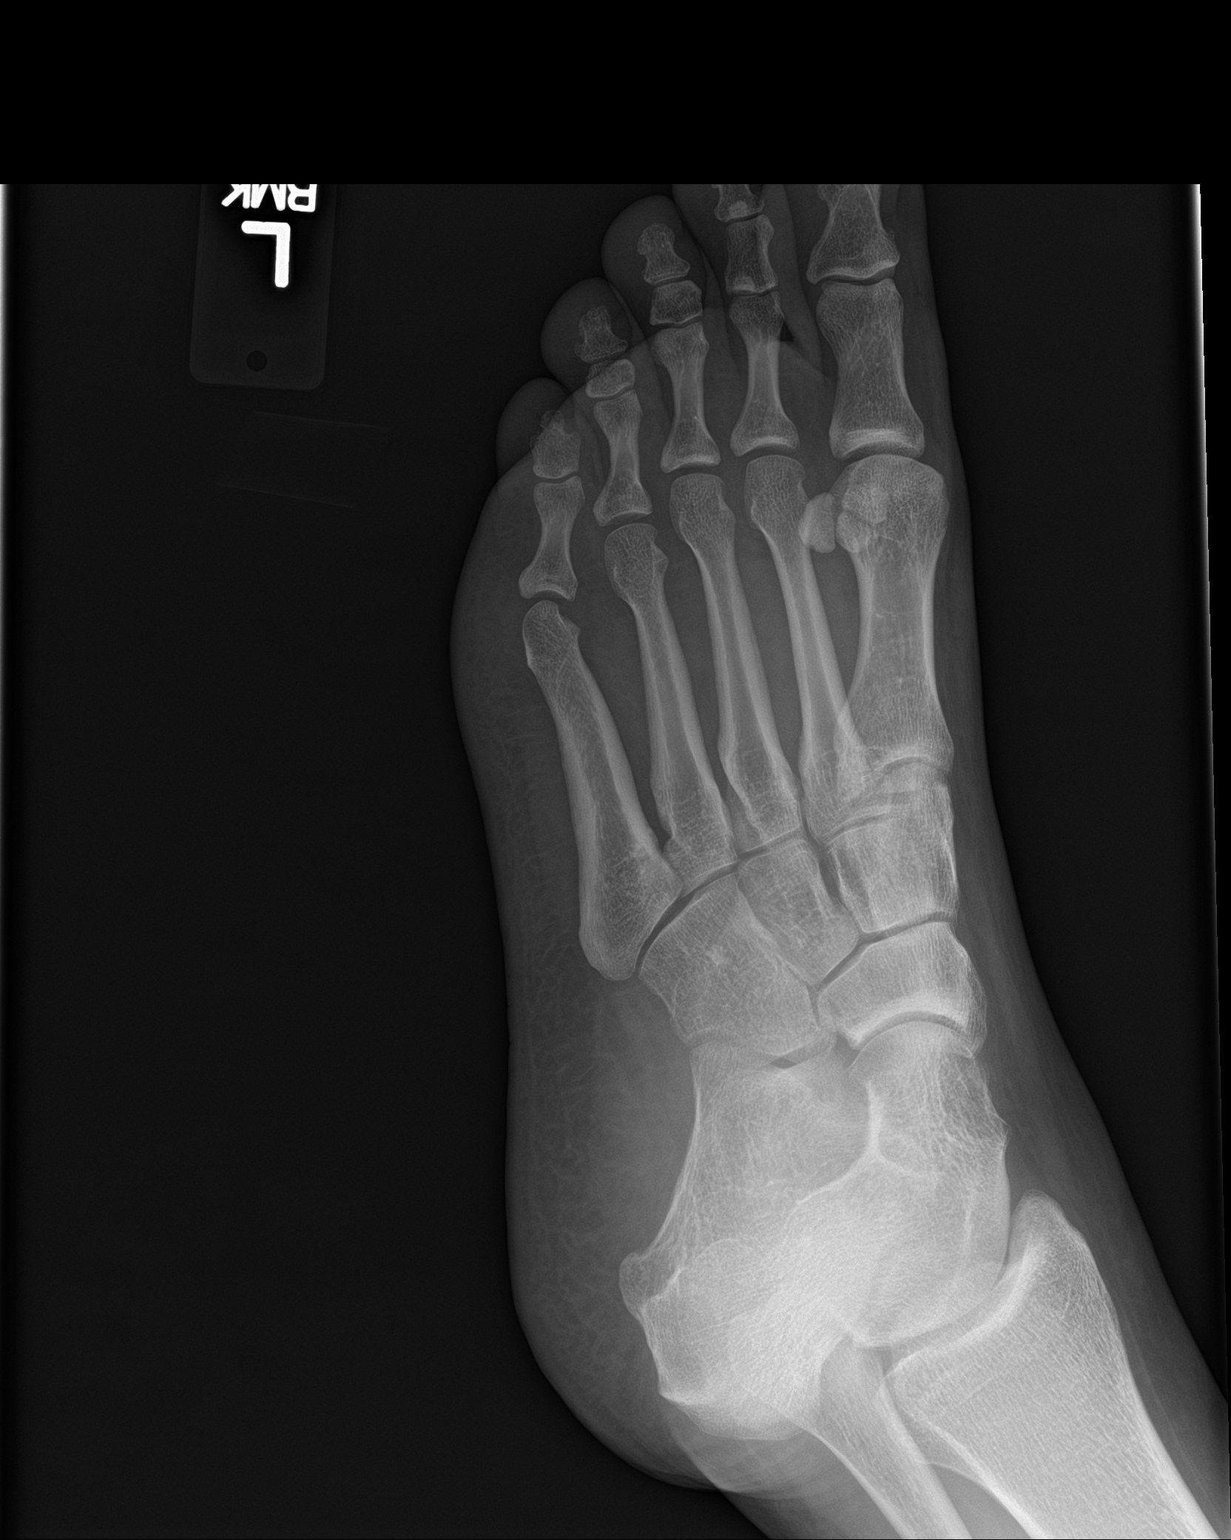

[foot lat]
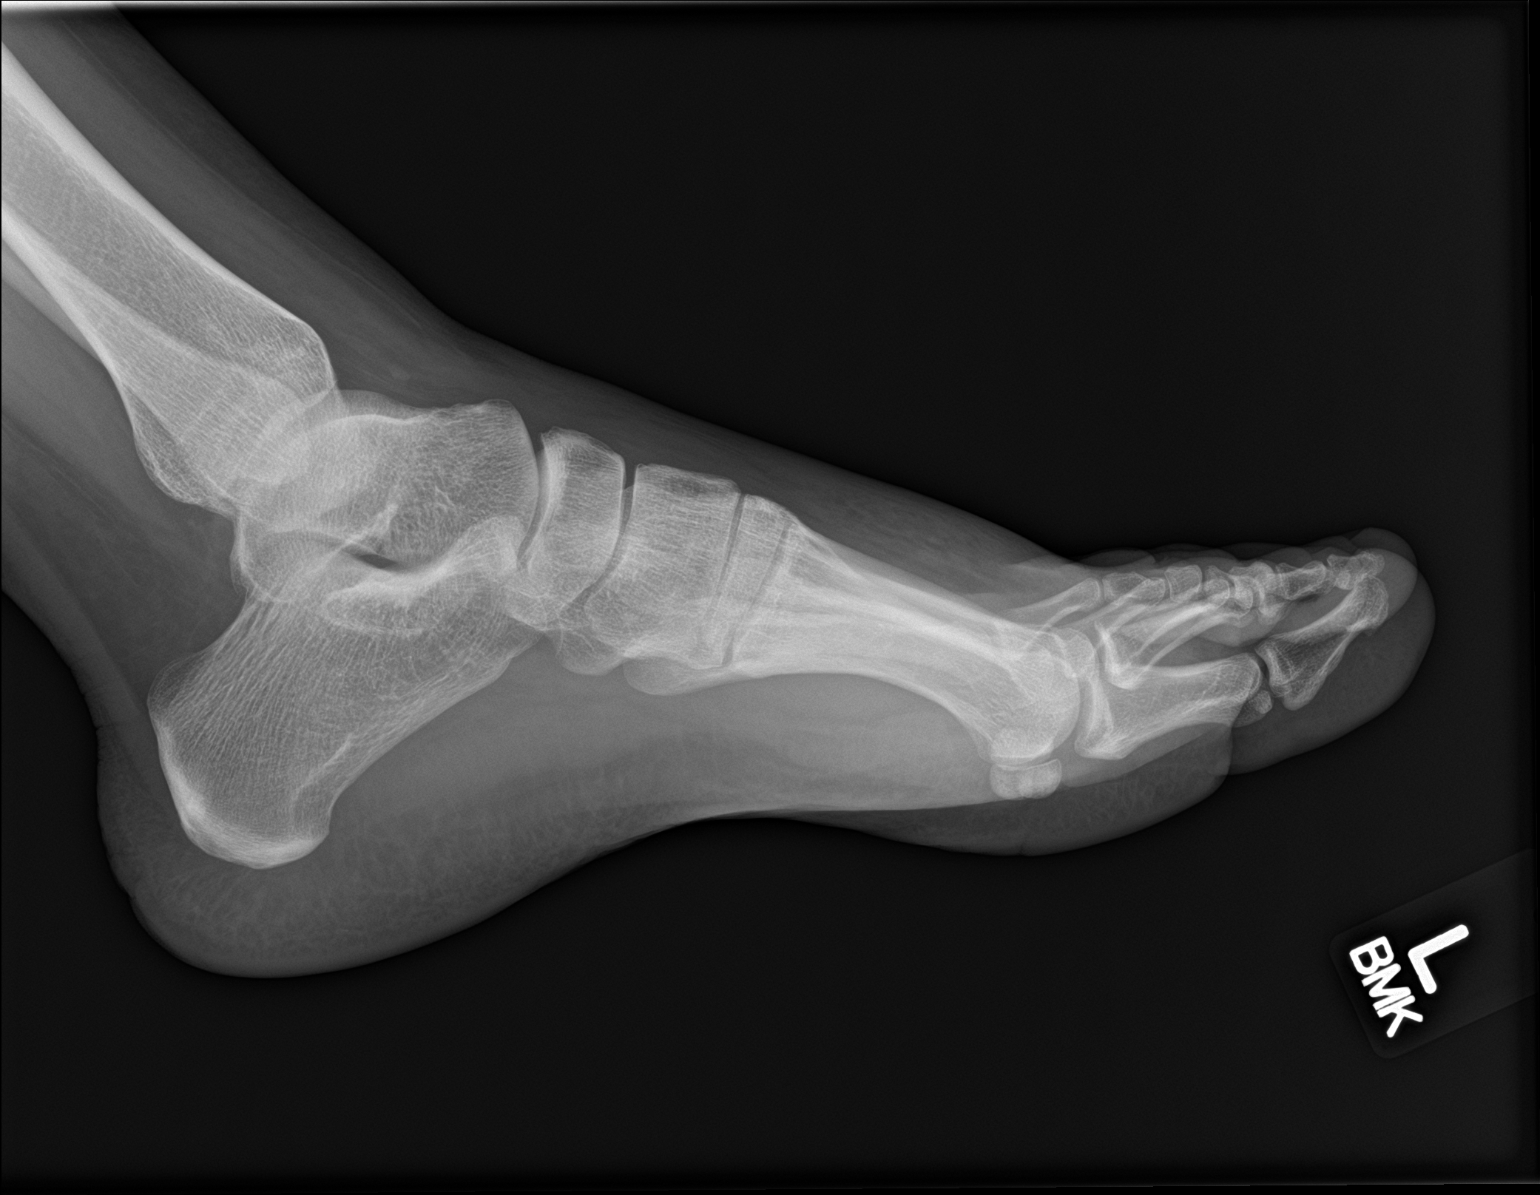

[3 of 3 positions shown; findings below may reference images not displayed]

FINDINGS: There is no evidence of fracture or dislocation. Bipartite medial
sesamoid. Dorsal soft tissue swelling.
IMPRESSION: No acute osseus injury.
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
## 2021-10-14 IMAGING — CR DG FOREARM 2V*L*
1 series · 2 of 2 positions shown · non-contrast
Comparison: None.

CLINICAL DATA: Pain, bruising, and swelling.  Fall.

EXAM:
LEFT FOREARM - 2 VIEW

[Series 1: dg forearm left · 0.14mm/px · 2 of 2 slices shown]
[im 1/2]
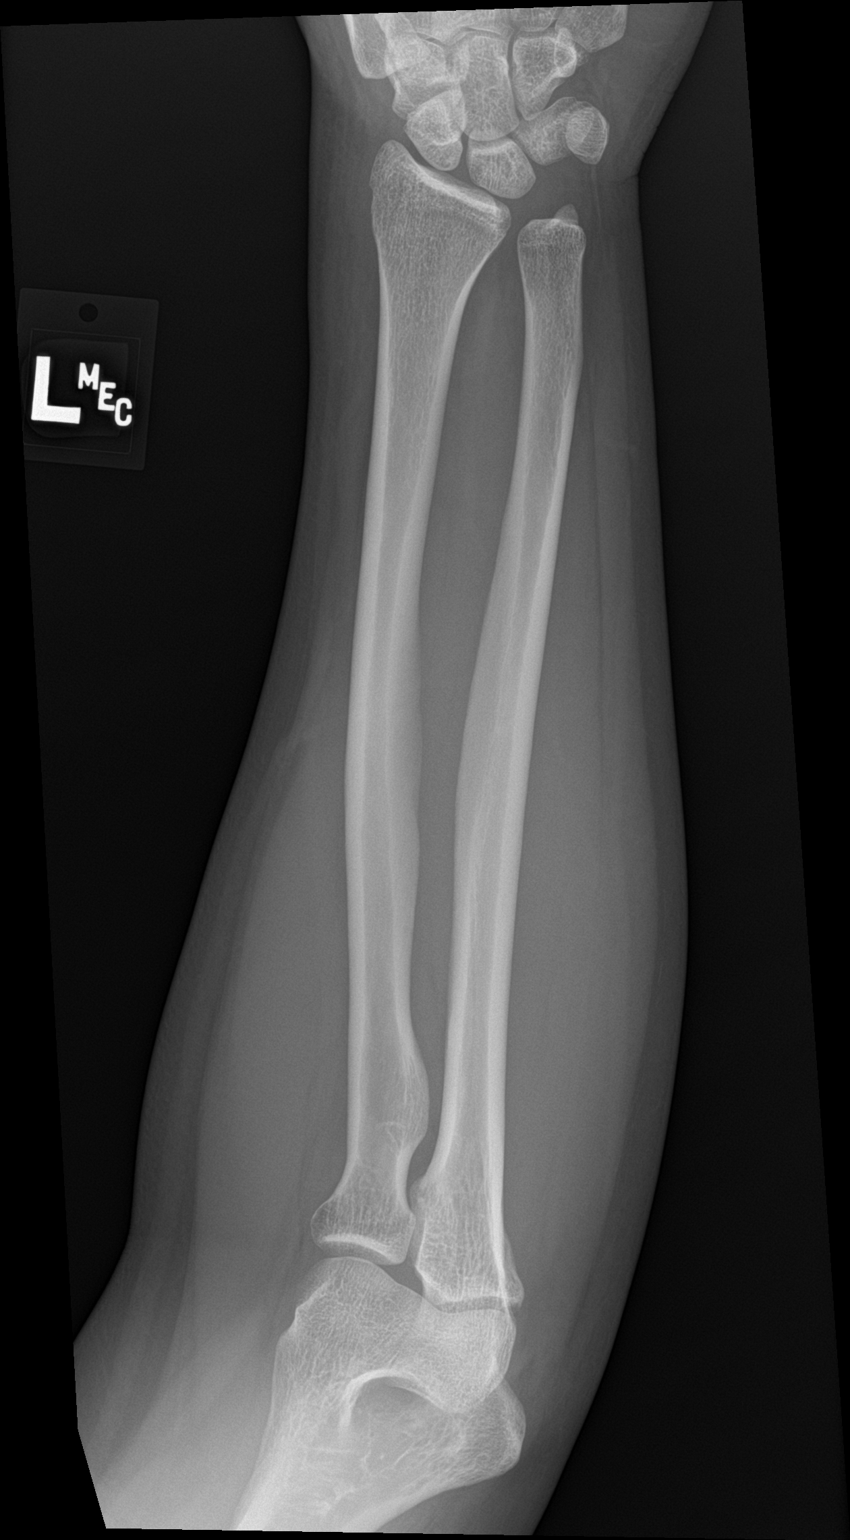
[im 2/2]
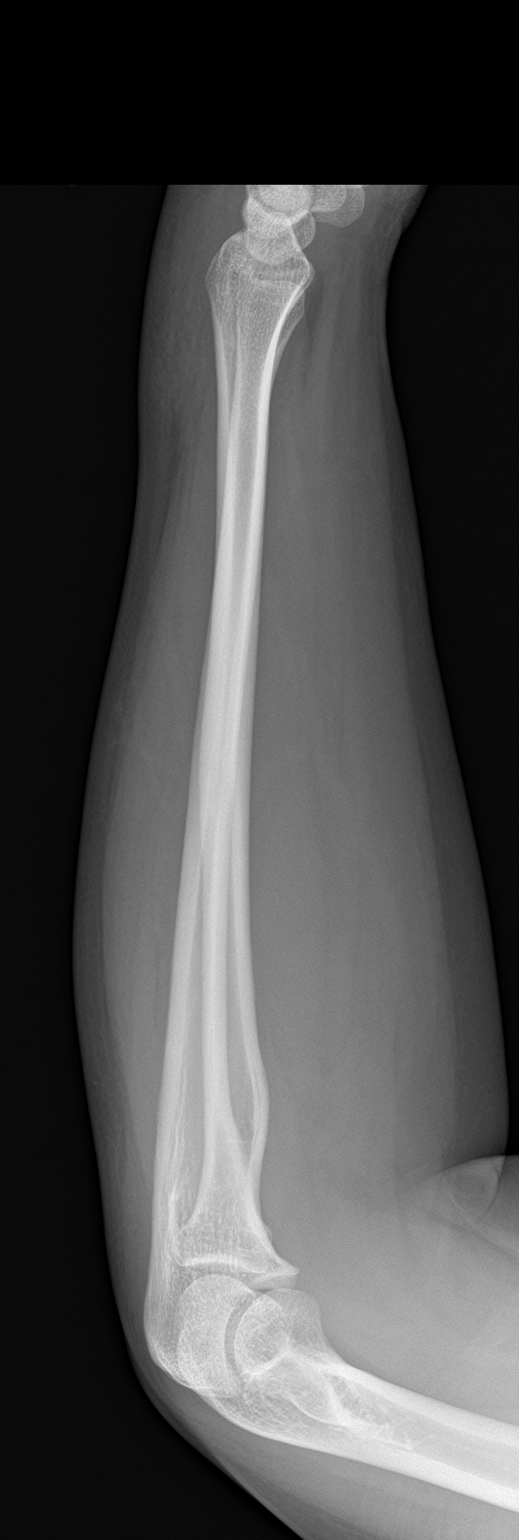

[2 of 2 positions shown; findings below may reference images not displayed]

FINDINGS: There is no evidence of fracture or other focal bone lesions. Soft
tissues are unremarkable.
IMPRESSION: Negative.
